# Patient Record
Sex: Male | Born: 1960 | Race: White | Hispanic: No | Marital: Single | Smoking: Never smoker
Health system: Southern US, Community
[De-identification: ages and names within clinical notes are randomized; demographics above are authoritative.]

## PROBLEM LIST (undated history)

## (undated) DIAGNOSIS — K429 Umbilical hernia without obstruction or gangrene: Secondary | ICD-10-CM

## (undated) DIAGNOSIS — N200 Calculus of kidney: Secondary | ICD-10-CM

## (undated) DIAGNOSIS — T63301A Toxic effect of unspecified spider venom, accidental (unintentional), initial encounter: Secondary | ICD-10-CM

## (undated) HISTORY — DX: Calculus of kidney: N20.0

## (undated) HISTORY — DX: Umbilical hernia without obstruction or gangrene: K42.9

## (undated) HISTORY — PX: UMBILICAL HERNIA REPAIR: SUR1181

## (undated) HISTORY — DX: Toxic effect of unspecified spider venom, accidental (unintentional), initial encounter: T63.301A

---

## 2014-09-23 ENCOUNTER — Other Ambulatory Visit (INDEPENDENT_AMBULATORY_CARE_PROVIDER_SITE_OTHER): Payer: BLUE CROSS/BLUE SHIELD

## 2014-09-23 ENCOUNTER — Ambulatory Visit (INDEPENDENT_AMBULATORY_CARE_PROVIDER_SITE_OTHER): Payer: BLUE CROSS/BLUE SHIELD | Admitting: Internal Medicine

## 2014-09-23 ENCOUNTER — Encounter: Payer: Self-pay | Admitting: Internal Medicine

## 2014-09-23 VITALS — BP 104/66 | HR 76 | Ht 69.25 in | Wt 313.2 lb

## 2014-09-23 DIAGNOSIS — K5732 Diverticulitis of large intestine without perforation or abscess without bleeding: Secondary | ICD-10-CM

## 2014-09-23 DIAGNOSIS — Z1211 Encounter for screening for malignant neoplasm of colon: Secondary | ICD-10-CM

## 2014-09-23 LAB — COMPREHENSIVE METABOLIC PANEL
ALK PHOS: 176 U/L — AB (ref 39–117)
ALT: 60 U/L — ABNORMAL HIGH (ref 0–53)
AST: 44 U/L — ABNORMAL HIGH (ref 0–37)
Albumin: 3.2 g/dL — ABNORMAL LOW (ref 3.5–5.2)
BILIRUBIN TOTAL: 1 mg/dL (ref 0.2–1.2)
BUN: 17 mg/dL (ref 6–23)
CO2: 28 mEq/L (ref 19–32)
Calcium: 8.7 mg/dL (ref 8.4–10.5)
Chloride: 102 mEq/L (ref 96–112)
Creatinine, Ser: 1 mg/dL (ref 0.4–1.5)
GFR: 83.39 mL/min (ref >60.00)
Glucose, Bld: 103 mg/dL — ABNORMAL HIGH (ref 70–99)
POTASSIUM: 4.9 meq/L (ref 3.5–5.1)
Sodium: 137 mEq/L (ref 135–145)
Total Protein: 6.6 g/dL (ref 6.0–8.3)

## 2014-09-23 LAB — CBC WITH DIFFERENTIAL/PLATELET
Basophils Absolute: 0.1 10*3/uL (ref 0.0–0.1)
Basophils Relative: 0.5 % (ref 0.0–3.0)
EOS ABS: 0.1 10*3/uL (ref 0.0–0.7)
EOS PCT: 1 % (ref 0.0–5.0)
HEMATOCRIT: 44.3 % (ref 39.0–52.0)
HEMOGLOBIN: 14.6 g/dL (ref 13.0–17.0)
Lymphocytes Relative: 67.1 % — ABNORMAL HIGH (ref 12.0–46.0)
Lymphs Abs: 8.9 10*3/uL — ABNORMAL HIGH (ref 0.7–4.0)
MCHC: 32.9 g/dL (ref 30.0–36.0)
MCV: 89 fl (ref 78.0–100.0)
MONO ABS: 1.3 10*3/uL — AB (ref 0.1–1.0)
Monocytes Relative: 9.9 % (ref 3.0–12.0)
Neutro Abs: 2.9 10*3/uL (ref 1.4–7.7)
Neutrophils Relative %: 21.5 % — ABNORMAL LOW (ref 43.0–77.0)
Platelets: 262 10*3/uL (ref 150.0–400.0)
RBC: 4.98 Mil/uL (ref 4.22–5.81)
RDW: 14.2 % (ref 11.5–15.5)
WBC: 13.3 10*3/uL — AB (ref 4.0–10.5)

## 2014-09-23 MED ORDER — AMOXICILLIN-POT CLAVULANATE 875-125 MG PO TABS: 1.0000 | ORAL_TABLET | Freq: Two times a day (BID) | ORAL | Status: DC

## 2014-09-23 NOTE — Progress Notes (Signed)
Subjective:    Patient ID: Brian BurgerGarry B Pizzi, male    DOB: 10-Oct-1960, 54 y.o.   MRN: [id number]  HPI The patient is a very pleasant middle-aged white man here because of 3-4 week history of lower quadrant abdominal pain, constipation and some low-grade fevers and even higher fevers. This is been waxing and waning. His stools are small and pellet like at times. He gets temperatures are feels hot and then chilled in the evening. He denies any urinary symptoms. He has not had any rectal bleeding. He says he had a temperature to 102 at some point. He feels very bloated after he eats as well. He has a recurrent umbilical hernia the gets a little sore but is not hard or firm or anything like that. 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 He denies having problems like this before. He has never had a colonoscopy.  Allergies  Allergen Reactions  . Codeine Nausea Only   No outpatient prescriptions prior to visit.   No facility-administered medications prior to visit.   Past Medical History  Diagnosis Date  . Kidney stones     never been diagnosed but pt states he passed one  . Spider bite wound     left wrist  . Umbilical hernia    Past Surgical History  Procedure Laterality Date  . Umbilical hernia repair     History   Social History  . Marital Status: Married    Spouse Name: N/A    Number of Children: 2  . Years of Education: N/A   Occupational History  . landscaper    Social History Main Topics  . Smoking status: Never Smoker   . Smokeless tobacco: Never Used  . Alcohol Use: 0.0 oz/week    0 Not specified per week     Comment: occasional beer or wine but rare  . Drug Use: No  . Sexual Activity: None   Other Topics Concern  . None   Social History Narrative   Patient  reports that he is divorced. He has a girlfriend in the [place] area.   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 Breast cancer Mother   . Cancer Maternal Aunt     type unknown  . Cancer Maternal Grandmother     type unknown    Review of Systems As per history of present illness. All other review of systems negative except for some lower extremity edema. This is bilateral.    Objective:   Physical Exam General:  Well-developed, well-nourished and in no acute distress, he is obese Eyes:  anicteric. ENT:   Mouth and posterior pharynx free of lesions.  Neck:   supple w/o thyromegaly or mass.  Lungs: Clear to auscultation bilaterally. Heart:  S1S2, no rubs, murmurs, gallops. Abdomen:  Obese soft, mildly tender to deep palpation in the left lower quadrant, no hepatosplenomegaly, or mass and BS+. He has a slight small reducible umbilical hernia Rectal: No mass, prostate is normal. Normal resting tone. Stool was brown and heme-negative it is soft area Lymph:  no cervical or supraclavicular adenopathy. Extremities:  Trace LE edema bilateral edema Skin   no rash. Neuro:  A&O x 3.  Psych:  appropriate mood and  Affect.      Assessment & Plan:  Diverticulitis of colon - Plan: amoxicillin-clavulanate (AUGMENTIN) 875-125 MG per tablet  Colon cancer screening  1. Medical scenario is most compatible with diverticulitis I think. I will treat that with Augmentin 875 twice a day for 10 days. 2. Subsequently he will have a screening colonoscopy. He knows to call back sooner if he is not better or worse.The risks and benefits as well as alternatives of endoscopic procedure(s) have been discussed and reviewed. All questions answered. The patient agrees to proceed. 3. We have decided to hold off on imaging at this point but is diagnostic uncertainty is an issue would probably perform  a CT of the abdomen and pelvis with contrast. 4. CBC and CMET today.  CC: Kerby Nora, MD

## 2014-09-23 NOTE — Patient Instructions (Addendum)
Your physician has requested that you go to the basement for the following lab work before leaving today: CBC, CMET  We have sent the following medications to your pharmacy for you to pick up at your convenience: Augmentin  You have been scheduled for a colonoscopy. Please follow written instructions given to you at your visit today.  Please pick up your prep supplies at the pharmacy. If you use inhalers (even only as needed), please bring them with you on the day of your procedure.  We are giving you a handout to read on diverticulosis today.  I appreciate the opportunity to care for you. Stan Headarl Gessner, M.D. FACG

## 2014-09-26 NOTE — Progress Notes (Signed)
Quick Note:  Labs show: 1) Elevated WBC - goes along with diverticulitis - not alarming 2) Abnormal liver chemistries - does he have a hx of this??  Is he feeling any better? ______

## 2014-10-25 ENCOUNTER — Ambulatory Visit (AMBULATORY_SURGERY_CENTER): Payer: BLUE CROSS/BLUE SHIELD | Admitting: Internal Medicine

## 2014-10-25 ENCOUNTER — Other Ambulatory Visit (INDEPENDENT_AMBULATORY_CARE_PROVIDER_SITE_OTHER): Payer: BLUE CROSS/BLUE SHIELD

## 2014-10-25 ENCOUNTER — Encounter: Payer: Self-pay | Admitting: Internal Medicine

## 2014-10-25 VITALS — BP 123/77 | HR 156 | Temp 95.9°F | Resp 18 | Ht 69.0 in | Wt 313.0 lb

## 2014-10-25 DIAGNOSIS — R740 Nonspecific elevation of levels of transaminase and lactic acid dehydrogenase [LDH]: Secondary | ICD-10-CM

## 2014-10-25 DIAGNOSIS — R748 Abnormal levels of other serum enzymes: Secondary | ICD-10-CM

## 2014-10-25 DIAGNOSIS — Z1211 Encounter for screening for malignant neoplasm of colon: Secondary | ICD-10-CM

## 2014-10-25 LAB — HEPATIC FUNCTION PANEL
ALT: 24 U/L (ref 0–53)
AST: 18 U/L (ref 0–37)
Albumin: 3.6 g/dL (ref 3.5–5.2)
Alkaline Phosphatase: 81 U/L (ref 39–117)
Bilirubin, Direct: 0.3 mg/dL (ref 0.0–0.3)
Total Bilirubin: 1.6 mg/dL — ABNORMAL HIGH (ref 0.2–1.2)
Total Protein: 6.4 g/dL (ref 6.0–8.3)

## 2014-10-25 NOTE — Progress Notes (Signed)
Procedure ends, to recovery, report given and VSS. 

## 2014-10-25 NOTE — Op Note (Signed)
[facility] [address] [place], [id number]   COLONOSCOPY PROCEDURE REPORT  PATIENT: Brian BurgerJones, Hicks B  MR#: [id number] BIRTHDATE: Nov 04, 1960 , 53  yrs. old GENDER: male ENDOSCOPIST: Iva Booparl E Maitlyn Penza, MD, [facility] PROCEDURE DATE:  10/25/2014 PROCEDURE:   Colonoscopy, screening First Screening Colonoscopy - Avg.  risk and is 50 yrs.  old or older Yes.  Prior Negative Screening - Now for repeat screening. N/A  History of Adenoma - Now for follow-up colonoscopy & has been > or = to 3 yrs.  N/A  Polyps Removed Today? No.  Polyps Removed Today? No.  Recommend repeat exam, <10 yrs? Polyps Removed Today? No.  Recommend repeat exam, <10 yrs? No. ASA CLASS:   Class II INDICATIONS:average risk patient for colorectal cancer. MEDICATIONS: Propofol 300 mg IV and Monitored anesthesia care  DESCRIPTION OF PROCEDURE:   After the risks benefits and alternatives of the procedure were thoroughly explained, informed consent was obtained.  The digital rectal exam revealed no abnormalities of the rectum, revealed no prostatic nodules, and revealed the prostate was not enlarged.   The LB [id number] endoscope was introduced through the anus and advanced to the cecum, which was identified by both the appendix and ileocecal valve. No adverse events experienced.   The quality of the prep was adequate, using MiraLax  The instrument was then slowly withdrawn as the colon was fully examined.      COLON FINDINGS: A normal appearing cecum, ileocecal valve, and appendiceal orifice were identified.  The ascending, transverse, descending, sigmoid colon, and rectum appeared unremarkable. Retroflexed views revealed no abnormalities. The time to cecum=2 minutes 42 seconds.  Withdrawal time=13 minutes 01 seconds.  The scope was withdrawn and the procedure completed. COMPLICATIONS: There were no immediate complications.  ENDOSCOPIC IMPRESSION: Normal colonoscopy - adequate prep - first  screening  RECOMMENDATIONS: 1.  Repeat colonoscopy 10 years. 2.  Go to lab today - testing to evaluate abnormal LFT's  eSigned:  Iva Booparl E Timmi Devora, MD, [facility] 10/25/2014 4:55 PM   cc: The Patient and Kerby Norahristopher Groner, MD

## 2014-10-25 NOTE — Patient Instructions (Addendum)
The colonoscopy looked normal.  I have ordered some blood tests to try to understand why you have abnormal liver chemistries - please go to lab today.  Next routine colonoscopy in 10 years - 2026   I appreciate the opportunity to care for you. Iva Booparl E. Gessner, MD, FACG  YOU HAD AN ENDOSCOPIC PROCEDURE TODAY AT THE [facility]: Refer to the procedure report that was given to you for any specific questions about what was found during the examination.  If the procedure report does not answer your questions, please call your gastroenterologist to clarify.  If you requested that your care partner not be given the details of your procedure findings, then the procedure report has been included in a sealed envelope for you to review at your convenience later.  YOU SHOULD EXPECT: Some feelings of bloating in the abdomen. Passage of more gas than usual.  Walking can help get rid of the air that was put into your GI tract during the procedure and reduce the bloating. If you had a lower endoscopy (such as a colonoscopy or flexible sigmoidoscopy) you may notice spotting of blood in your stool or on the toilet paper. If you underwent a bowel prep for your procedure, then you may not have a normal bowel movement for a few days.  DIET: Your first meal following the procedure should be a light meal and then it is ok to progress to your normal diet.  A half-sandwich or bowl of soup is an example of a good first meal.  Heavy or fried foods are harder to digest and may make you feel nauseous or bloated.  Likewise meals heavy in dairy and vegetables can cause extra gas to form and this can also increase the bloating.  Drink plenty of fluids but you should avoid alcoholic beverages for 24 hours.  ACTIVITY: Your care partner should take you home directly after the procedure.  You should plan to take it easy, moving slowly for the rest of the day.  You can resume normal activity the day after the  procedure however you should NOT DRIVE or use heavy machinery for 24 hours (because of the sedation medicines used during the test).    SYMPTOMS TO REPORT IMMEDIATELY: A gastroenterologist can be reached at any hour.  During normal business hours, 8:30 AM to 5:00 PM Monday through Friday, call [phone].  After hours and on weekends, please call the GI answering service at [phone] who will take a message and have the physician on call contact you.   Following lower endoscopy (colonoscopy or flexible sigmoidoscopy):  Excessive amounts of blood in the stool  Significant tenderness or worsening of abdominal pains  Swelling of the abdomen that is new, acute  Fever of 100F or higher  FOLLOW UP: If any biopsies were taken you will be contacted by phone or by letter within the next 1-3 weeks.  Call your gastroenterologist if you have not heard about the biopsies in 3 weeks.  Our staff will call the home number listed on your records the next business day following your procedure to check on you and address any questions or concerns that you may have at that time regarding the information given to you following your procedure. This is a courtesy call and so if there is no answer at the home number and we have not heard from you through the emergency physician on call, we will assume that you have returned to your regular daily  activities without incident.  SIGNATURES/CONFIDENTIALITY: You and/or your care partner have signed paperwork which will be entered into your electronic medical record.  These signatures attest to the fact that that the information above on your After Visit Summary has been reviewed and is understood.  Full responsibility of the confidentiality of this discharge information lies with you and/or your care-partner.  Lab will come up to draw your blood before you go home today.  Read all of the handouts given to you by your recovery room nurse.

## 2014-10-28 ENCOUNTER — Telehealth: Payer: Self-pay | Admitting: *Deleted

## 2014-10-28 LAB — FERRITIN: FERRITIN: 126 ng/mL (ref 22.0–322.0)

## 2014-10-28 LAB — HEPATITIS B SURFACE ANTIGEN: Hepatitis B Surface Ag: NEGATIVE

## 2014-10-28 LAB — HEPATITIS C ANTIBODY: HCV Ab: NEGATIVE

## 2014-10-28 NOTE — Telephone Encounter (Signed)
  Follow up Call-  Call back number 10/25/2014  Post procedure Call Back phone  # [phone]  Permission to leave phone message Yes     Patient questions:  Do you have a fever, pain , or abdominal swelling? No. Pain Score  0 *  Have you tolerated food without any problems? Yes.    Have you been able to return to your normal activities? Yes.    Do you have any questions about your discharge instructions: Diet   No. Medications  No. Follow up visit  No.  Do you have questions or concerns about your Care? No.  Actions: * If pain score is 4 or above: No action needed, pain <4.

## 2014-10-29 LAB — ANA: Anti Nuclear Antibody(ANA): NEGATIVE

## 2014-10-29 NOTE — Progress Notes (Signed)
Quick Note:  Abnormal liver tests are all better and no signs of infection or autoimmune problems except mild increase bilirubin but that is consistent with benign condition called Gilbert's syndrome Bottom line is this is good news See me prn  Please send a copy to his PCP ______

## 2016-10-13 ENCOUNTER — Ambulatory Visit (INDEPENDENT_AMBULATORY_CARE_PROVIDER_SITE_OTHER): Payer: BLUE CROSS/BLUE SHIELD | Admitting: Physician Assistant

## 2016-10-13 VITALS — BP 130/80 | HR 80 | Temp 100.5°F | Resp 17 | Ht 71.0 in | Wt 314.0 lb

## 2016-10-13 DIAGNOSIS — R69 Illness, unspecified: Secondary | ICD-10-CM | POA: Diagnosis not present

## 2016-10-13 DIAGNOSIS — J111 Influenza due to unidentified influenza virus with other respiratory manifestations: Secondary | ICD-10-CM

## 2016-10-13 MED ORDER — NAPROXEN 500 MG PO TABS: 500.0000 mg | ORAL_TABLET | Freq: Two times a day (BID) | ORAL | 0 refills | Status: DC

## 2016-10-13 MED ORDER — OSELTAMIVIR PHOSPHATE 75 MG PO CAPS: 75.0000 mg | ORAL_CAPSULE | Freq: Two times a day (BID) | ORAL | 0 refills | Status: DC

## 2016-10-13 NOTE — Progress Notes (Signed)
  10/13/2016 4:04 PM   DOB: Sep 27, 1960 / MRN: [id number]  SUBJECTIVE:  Brian Randall is a 56 y.o. male presenting for "feeling hot" and generalized muscle aches.  Has a mild cough along with a mild HA.  He has not had the flu shot. ZHe feels that he is getting worse.  Has tried vitamin C and zinc.     There is no immunization history on file for this patient.   He is allergic to codeine.   He  has a past medical history of Kidney stones; Spider bite wound; and Umbilical hernia.    He  reports that he has never smoked. He has never used smokeless tobacco. He reports that he drinks alcohol. He reports that he does not use drugs. The patient  has a past surgical history that includes Umbilical hernia repair.  His family history includes Breast cancer in his mother; Cancer in his maternal aunt and maternal grandmother.  Review of Systems  Constitutional: Positive for malaise/fatigue. Negative for diaphoresis.  HENT: Positive for sore throat. Negative for congestion.   Respiratory: Negative for hemoptysis and shortness of breath.   Cardiovascular: Negative for chest pain and leg swelling.  Gastrointestinal: Negative for nausea.  Skin: Negative for rash.  Neurological: Negative for dizziness.    The problem list and medications were reviewed and updated by myself where necessary and exist elsewhere in the encounter.   OBJECTIVE:  BP 130/80 (BP Location: Right Arm, Patient Position: Sitting, Cuff Size: Normal)   Pulse 80   Temp (!) 100.5 F (38.1 C) (Oral)   Resp 17   Ht 5\' 11"  (1.803 m)   Wt (!) 314 lb (142.4 kg)   SpO2 95%   BMI 43.79 kg/m   Physical Exam  Constitutional: He is oriented to person, place, and time.  HENT:  Right Ear: External ear normal.  Left Ear: External ear normal.  Nose: Nose normal.  Mouth/Throat: Oropharynx is clear and moist.  Cardiovascular: Normal rate, regular rhythm and normal heart sounds.  Exam reveals no gallop, no friction rub and no  decreased pulses.   No murmur heard. Pulmonary/Chest: Effort normal and breath sounds normal. No respiratory distress. He has no decreased breath sounds. He has no wheezes. He has no rhonchi. He has no rales.  Musculoskeletal: He exhibits no edema.  Neurological: He is alert and oriented to person, place, and time.  Skin: Skin is warm and dry. He is not diaphoretic.    No results found for this or any previous visit (from the past 72 hour(s)).  No results found.  ASSESSMENT AND PLAN:  Brian Randall was seen today for cough and fever.  Diagnoses and all orders for this visit:  Influenza-like illness Comments: Day three total.  he has not had the flu shot. Will start tamiflu in the hopes of reducing the risk of influenza related complications.  Orders: -     oseltamivir (TAMIFLU) 75 MG capsule; Take 1 capsule (75 mg total) by mouth 2 (two) times daily. -     naproxen (NAPROSYN) 500 MG tablet; Take 1 tablet (500 mg total) by mouth 2 (two) times daily with a meal.    The patient is advised to call or return to clinic if he does not see an improvement in symptoms, or to seek the care of the closest emergency department if he worsens with the above plan.   Deliah BostonMichael Percy Winterrowd, MHS, PA-C [facility] 10/13/2016 4:04 PM

## 2016-10-13 NOTE — Patient Instructions (Signed)
     IF you received an x-ray today, you will receive an invoice from [facility]. Please contact [facility] at [phone] with questions or concerns regarding your invoice.   IF you received labwork today, you will receive an invoice from [facility]. Please contact [facility] at [phone] with questions or concerns regarding your invoice.   Our billing staff will not be able to assist you with questions regarding bills from these companies.  You will be contacted with the lab results as soon as they are available. The fastest way to get your results is to activate your My Chart account. Instructions are located on the last page of this paperwork. If you have not heard from us regarding the results in 2 weeks, please contact this office.     

## 2016-10-18 ENCOUNTER — Encounter: Payer: Self-pay | Admitting: Physician Assistant

## 2016-10-18 ENCOUNTER — Ambulatory Visit (INDEPENDENT_AMBULATORY_CARE_PROVIDER_SITE_OTHER): Payer: BLUE CROSS/BLUE SHIELD | Admitting: Physician Assistant

## 2016-10-18 VITALS — BP 109/74 | HR 70 | Temp 98.4°F | Resp 16 | Ht 71.0 in | Wt 311.0 lb

## 2016-10-18 DIAGNOSIS — R05 Cough: Secondary | ICD-10-CM | POA: Diagnosis not present

## 2016-10-18 DIAGNOSIS — R69 Illness, unspecified: Secondary | ICD-10-CM | POA: Diagnosis not present

## 2016-10-18 DIAGNOSIS — J111 Influenza due to unidentified influenza virus with other respiratory manifestations: Secondary | ICD-10-CM

## 2016-10-18 DIAGNOSIS — R059 Cough, unspecified: Secondary | ICD-10-CM

## 2016-10-18 MED ORDER — AZITHROMYCIN 250 MG PO TABS: ORAL_TABLET | ORAL | 0 refills | Status: DC

## 2016-10-18 NOTE — Patient Instructions (Signed)
     IF you received an x-ray today, you will receive an invoice from [facility]. Please contact [facility] at [phone] with questions or concerns regarding your invoice.   IF you received labwork today, you will receive an invoice from [facility]. Please contact [facility] at [phone] with questions or concerns regarding your invoice.   Our billing staff will not be able to assist you with questions regarding bills from these companies.  You will be contacted with the lab results as soon as they are available. The fastest way to get your results is to activate your My Chart account. Instructions are located on the last page of this paperwork. If you have not heard from us regarding the results in 2 weeks, please contact this office.     

## 2016-10-18 NOTE — Progress Notes (Signed)
  10/18/2016 2:12 PM   DOB: Feb 06, 1961 / MRN: [id number]  SUBJECTIVE:  Sherryll BurgerGarry B Hehn is a 56 y.o. male presenting for recheck of cough and flu like illness. Has been taking tamiflu and naprosyn with good relief.   He is here because he continues to cough and has left sided paraspinal pain with coughing only.   He is allergic to codeine.   He  has a past medical history of Kidney stones; Spider bite wound; and Umbilical hernia.    He  reports that he has never smoked. He has never used smokeless tobacco. He reports that he drinks alcohol. He reports that he does not use drugs. He  has no sexual activity history on file. The patient  has a past surgical history that includes Umbilical hernia repair.  His family history includes Breast cancer in his mother; Cancer in his maternal aunt and maternal grandmother.  Review of Systems  Constitutional: Negative for fever.  Respiratory: Positive for cough. Negative for hemoptysis, sputum production, shortness of breath and wheezing.   Cardiovascular: Negative for leg swelling.  Gastrointestinal: Negative for nausea.  Skin: Negative for rash.  Neurological: Negative for dizziness.    The problem list and medications were reviewed and updated by myself where necessary and exist elsewhere in the encounter.   OBJECTIVE:  BP 109/74   Pulse 70   Temp 98.4 F (36.9 C) (Oral)   Resp 16   Ht 5\' 11"  (1.803 m)   Wt (!) 311 lb (141.1 kg)   SpO2 96%   BMI 43.38 kg/m   Wt Readings from Last 3 Encounters:  10/18/16 (!) 311 lb (141.1 kg)  10/13/16 (!) 314 lb (142.4 kg)  10/25/14 (!) 313 lb (142 kg)   Temp Readings from Last 3 Encounters:  10/18/16 98.4 F (36.9 C) (Oral)  10/13/16 (!) 100.5 F (38.1 C) (Oral)  10/25/14 (!) 95.9 F (35.5 C) (Tympanic)   BP Readings from Last 3 Encounters:  10/18/16 109/74  10/13/16 130/80  10/25/14 123/77   Pulse Readings from Last 3 Encounters:  10/18/16 70  10/13/16 80  10/25/14 (!) 156      Physical Exam  Constitutional: He is oriented to person, place, and time. He appears well-developed and well-nourished. No distress.  Cardiovascular: Normal rate and regular rhythm.   Pulmonary/Chest: Effort normal and breath sounds normal. He has no wheezes. He has no rales.  Neurological: He is alert and oriented to person, place, and time.  Skin: He is not diaphoretic.    No results found for this or any previous visit (from the past 72 hour(s)).  No results found.  ASSESSMENT AND PLAN:  [name] was seen today for follow-up and shoulder pain.  Diagnoses and all orders for this visit:  Influenza-like illness: Resolving.  However he did just have the flu and given the possibility of a 2/2 pneumonia I think it the benefits of zpack outweigh the risk.  He is in agreement.   Cough -     azithromycin (ZITHROMAX) 250 MG tablet; Take 2 on day one and one daily thereafter.    The patient is advised to call or return to clinic if he does not see an improvement in symptoms, or to seek the care of the closest emergency department if he worsens with the above plan.   Deliah BostonMichael Clark, MHS, PA-C [facility] 10/18/2016 2:12 PM

## 2019-06-04 ENCOUNTER — Inpatient Hospital Stay (HOSPITAL_COMMUNITY): Payer: BC Managed Care – PPO

## 2019-06-04 ENCOUNTER — Other Ambulatory Visit: Payer: Self-pay

## 2019-06-04 ENCOUNTER — Encounter (HOSPITAL_COMMUNITY): Payer: Self-pay | Admitting: Emergency Medicine

## 2019-06-04 ENCOUNTER — Emergency Department (HOSPITAL_COMMUNITY): Payer: BC Managed Care – PPO

## 2019-06-04 ENCOUNTER — Inpatient Hospital Stay (HOSPITAL_COMMUNITY)
Admission: EM | Admit: 2019-06-04 | Discharge: 2019-06-06 | DRG: 041 | Disposition: A | Discharge status: Home / Self Care | Payer: BC Managed Care – PPO | Attending: Neurology | Admitting: Neurology

## 2019-06-04 DIAGNOSIS — E039 Hypothyroidism, unspecified: Secondary | ICD-10-CM | POA: Diagnosis present

## 2019-06-04 DIAGNOSIS — G43109 Migraine with aura, not intractable, without status migrainosus: Secondary | ICD-10-CM | POA: Diagnosis not present

## 2019-06-04 DIAGNOSIS — I071 Rheumatic tricuspid insufficiency: Secondary | ICD-10-CM | POA: Diagnosis present

## 2019-06-04 DIAGNOSIS — R4701 Aphasia: Secondary | ICD-10-CM | POA: Diagnosis present

## 2019-06-04 DIAGNOSIS — R1310 Dysphagia, unspecified: Secondary | ICD-10-CM | POA: Diagnosis present

## 2019-06-04 DIAGNOSIS — G4733 Obstructive sleep apnea (adult) (pediatric): Secondary | ICD-10-CM

## 2019-06-04 DIAGNOSIS — Z885 Allergy status to narcotic agent status: Secondary | ICD-10-CM | POA: Diagnosis not present

## 2019-06-04 DIAGNOSIS — D751 Secondary polycythemia: Secondary | ICD-10-CM | POA: Diagnosis present

## 2019-06-04 DIAGNOSIS — R2990 Unspecified symptoms and signs involving the nervous system: Secondary | ICD-10-CM | POA: Diagnosis present

## 2019-06-04 DIAGNOSIS — G43909 Migraine, unspecified, not intractable, without status migrainosus: Secondary | ICD-10-CM | POA: Diagnosis present

## 2019-06-04 DIAGNOSIS — Z9282 Status post administration of tPA (rtPA) in a different facility within the last 24 hours prior to admission to current facility: Secondary | ICD-10-CM | POA: Diagnosis not present

## 2019-06-04 DIAGNOSIS — Z20828 Contact with and (suspected) exposure to other viral communicable diseases: Secondary | ICD-10-CM | POA: Diagnosis present

## 2019-06-04 DIAGNOSIS — K222 Esophageal obstruction: Secondary | ICD-10-CM | POA: Diagnosis present

## 2019-06-04 DIAGNOSIS — I6389 Other cerebral infarction: Secondary | ICD-10-CM | POA: Diagnosis not present

## 2019-06-04 DIAGNOSIS — G459 Transient cerebral ischemic attack, unspecified: Principal | ICD-10-CM | POA: Diagnosis present

## 2019-06-04 DIAGNOSIS — I63412 Cerebral infarction due to embolism of left middle cerebral artery: Secondary | ICD-10-CM | POA: Diagnosis not present

## 2019-06-04 DIAGNOSIS — I639 Cerebral infarction, unspecified: Secondary | ICD-10-CM | POA: Diagnosis not present

## 2019-06-04 DIAGNOSIS — H53461 Homonymous bilateral field defects, right side: Secondary | ICD-10-CM | POA: Diagnosis present

## 2019-06-04 DIAGNOSIS — N400 Enlarged prostate without lower urinary tract symptoms: Secondary | ICD-10-CM | POA: Diagnosis present

## 2019-06-04 DIAGNOSIS — Z9989 Dependence on other enabling machines and devices: Secondary | ICD-10-CM | POA: Diagnosis not present

## 2019-06-04 DIAGNOSIS — G43009 Migraine without aura, not intractable, without status migrainosus: Secondary | ICD-10-CM | POA: Diagnosis not present

## 2019-06-04 DIAGNOSIS — I10 Essential (primary) hypertension: Secondary | ICD-10-CM | POA: Diagnosis present

## 2019-06-04 LAB — COMPREHENSIVE METABOLIC PANEL
ALT: 20 U/L (ref 0–44)
AST: 20 U/L (ref 15–41)
Albumin: 3.8 g/dL (ref 3.5–5.0)
Alkaline Phosphatase: 62 U/L (ref 38–126)
Anion gap: 10 (ref 5–15)
BUN: 18 mg/dL (ref 6–20)
CO2: 27 mmol/L (ref 22–32)
Calcium: 9.5 mg/dL (ref 8.9–10.3)
Chloride: 102 mmol/L (ref 98–111)
Creatinine, Ser: 1.28 mg/dL — ABNORMAL HIGH (ref 0.61–1.24)
GFR calc Af Amer: >60 mL/min (ref >60)
GFR calc non Af Amer: >60 mL/min (ref >60)
Glucose, Bld: 118 mg/dL — ABNORMAL HIGH (ref 70–99)
Potassium: 4.5 mmol/L (ref 3.5–5.1)
Sodium: 139 mmol/L (ref 135–145)
Total Bilirubin: 1.8 mg/dL — ABNORMAL HIGH (ref 0.3–1.2)
Total Protein: 7.2 g/dL (ref 6.5–8.1)

## 2019-06-04 LAB — PROTIME-INR
INR: 1.1 (ref 0.8–1.2)
Prothrombin Time: 14.1 seconds (ref 11.4–15.2)

## 2019-06-04 LAB — DIFFERENTIAL
Abs Immature Granulocytes: 0.02 10*3/uL (ref 0.00–0.07)
Basophils Absolute: 0.1 10*3/uL (ref 0.0–0.1)
Basophils Relative: 1 %
Eosinophils Absolute: 0.1 10*3/uL (ref 0.0–0.5)
Eosinophils Relative: 1 %
Immature Granulocytes: 0 %
Lymphocytes Relative: 41 %
Lymphs Abs: 4.1 10*3/uL — ABNORMAL HIGH (ref 0.7–4.0)
Monocytes Absolute: 0.8 10*3/uL (ref 0.1–1.0)
Monocytes Relative: 8 %
Neutro Abs: 5 10*3/uL (ref 1.7–7.7)
Neutrophils Relative %: 49 %

## 2019-06-04 LAB — I-STAT CHEM 8, ED
BUN: 21 mg/dL — ABNORMAL HIGH (ref 6–20)
Calcium, Ion: 1.1 mmol/L — ABNORMAL LOW (ref 1.15–1.40)
Chloride: 100 mmol/L (ref 98–111)
Creatinine, Ser: 1.2 mg/dL (ref 0.61–1.24)
Glucose, Bld: 114 mg/dL — ABNORMAL HIGH (ref 70–99)
HCT: 54 % — ABNORMAL HIGH (ref 39.0–52.0)
Hemoglobin: 18.4 g/dL — ABNORMAL HIGH (ref 13.0–17.0)
Potassium: 4.3 mmol/L (ref 3.5–5.1)
Sodium: 140 mmol/L (ref 135–145)
TCO2: 27 mmol/L (ref 22–32)

## 2019-06-04 LAB — CBG MONITORING, ED: Glucose-Capillary: 120 mg/dL — ABNORMAL HIGH (ref 70–99)

## 2019-06-04 LAB — CBC
HCT: 55.5 % — ABNORMAL HIGH (ref 39.0–52.0)
Hemoglobin: 18.3 g/dL — ABNORMAL HIGH (ref 13.0–17.0)
MCH: 30.3 pg (ref 26.0–34.0)
MCHC: 33 g/dL (ref 30.0–36.0)
MCV: 92 fL (ref 80.0–100.0)
Platelets: 184 10*3/uL (ref 150–400)
RBC: 6.03 MIL/uL — ABNORMAL HIGH (ref 4.22–5.81)
RDW: 16.8 % — ABNORMAL HIGH (ref 11.5–15.5)
WBC: 10.2 10*3/uL (ref 4.0–10.5)
nRBC: 0 % (ref 0.0–0.2)

## 2019-06-04 LAB — ECHOCARDIOGRAM COMPLETE: Weight: 5070.4 oz

## 2019-06-04 LAB — GLUCOSE, CAPILLARY: Glucose-Capillary: 117 mg/dL — ABNORMAL HIGH (ref 70–99)

## 2019-06-04 LAB — APTT: aPTT: 32 seconds (ref 24–36)

## 2019-06-04 LAB — SARS CORONAVIRUS 2 (TAT 6-24 HRS): SARS Coronavirus 2: NEGATIVE

## 2019-06-04 LAB — MRSA PCR SCREENING: MRSA by PCR: NEGATIVE

## 2019-06-04 MED ORDER — ACETAMINOPHEN 160 MG/5ML PO SOLN: 650.0000 mg | ORAL | Status: DC | PRN

## 2019-06-04 MED ORDER — STROKE: EARLY STAGES OF RECOVERY BOOK
Freq: Once | Status: AC
  Administered 2019-06-04: 17:00:00
  Filled 2019-06-04: qty 1

## 2019-06-04 MED ORDER — INFLUENZA VAC SPLIT QUAD 0.5 ML IM SUSY
0.5000 mL | PREFILLED_SYRINGE | INTRAMUSCULAR | Status: DC
  Filled 2019-06-04: qty 0.5

## 2019-06-04 MED ORDER — PANTOPRAZOLE SODIUM 40 MG PO TBEC
40.0000 mg | DELAYED_RELEASE_TABLET | Freq: Every day | ORAL | Status: DC
  Administered 2019-06-04 – 2019-06-05 (×2): 40 mg via ORAL
  Filled 2019-06-04 (×2): qty 1

## 2019-06-04 MED ORDER — CHLORHEXIDINE GLUCONATE CLOTH 2 % EX PADS
6.0000 | MEDICATED_PAD | Freq: Every day | CUTANEOUS | Status: DC
  Administered 2019-06-05 – 2019-06-06 (×2): 6 via TOPICAL

## 2019-06-04 MED ORDER — CLEVIDIPINE BUTYRATE 0.5 MG/ML IV EMUL: 0.0000 mg/h | INTRAVENOUS | Status: DC

## 2019-06-04 MED ORDER — ACETAMINOPHEN 650 MG RE SUPP: 650.0000 mg | RECTAL | Status: DC | PRN

## 2019-06-04 MED ORDER — ALTEPLASE (STROKE) FULL DOSE INFUSION
90.0000 mg | Freq: Once | INTRAVENOUS | Status: AC
  Administered 2019-06-04: 90 mg via INTRAVENOUS
  Filled 2019-06-04: qty 100

## 2019-06-04 MED ORDER — ACETAMINOPHEN 325 MG PO TABS
650.0000 mg | ORAL_TABLET | ORAL | Status: DC | PRN
  Administered 2019-06-05: 650 mg via ORAL
  Filled 2019-06-04: qty 2

## 2019-06-04 MED ORDER — SODIUM CHLORIDE 0.9 % IV SOLN
INTRAVENOUS | Status: DC
  Administered 2019-06-04: 14:00:00 via INTRAVENOUS

## 2019-06-04 MED ORDER — SODIUM CHLORIDE 0.9% FLUSH: 3.0000 mL | Freq: Once | INTRAVENOUS | Status: DC

## 2019-06-04 MED ORDER — PERFLUTREN LIPID MICROSPHERE
1.0000 mL | INTRAVENOUS | Status: AC | PRN
  Administered 2019-06-04: 4 mL via INTRAVENOUS
  Filled 2019-06-04: qty 10

## 2019-06-04 MED ORDER — CLEVIDIPINE BUTYRATE 0.5 MG/ML IV EMUL
INTRAVENOUS | Status: AC
  Filled 2019-06-04: qty 50

## 2019-06-04 MED ORDER — PANTOPRAZOLE SODIUM 40 MG IV SOLR: 40.0000 mg | Freq: Every day | INTRAVENOUS | Status: DC

## 2019-06-04 MED ORDER — IOHEXOL 350 MG/ML SOLN
100.0000 mL | Freq: Once | INTRAVENOUS | Status: AC | PRN
  Administered 2019-06-04: 12:00:00 100 mL via INTRAVENOUS

## 2019-06-04 MED ORDER — SENNOSIDES-DOCUSATE SODIUM 8.6-50 MG PO TABS: 1.0000 | ORAL_TABLET | Freq: Every evening | ORAL | Status: DC | PRN

## 2019-06-04 MED ORDER — ONDANSETRON HCL 4 MG/2ML IJ SOLN
4.0000 mg | Freq: Once | INTRAMUSCULAR | Status: AC
  Administered 2019-06-04: 14:00:00 4 mg via INTRAVENOUS
  Filled 2019-06-04: qty 2

## 2019-06-04 MED ORDER — SODIUM CHLORIDE 0.9 % IV SOLN
50.0000 mL | Freq: Once | INTRAVENOUS | Status: AC
  Administered 2019-06-04: 50 mL via INTRAVENOUS

## 2019-06-04 NOTE — Progress Notes (Signed)
Pharmacist Code Stroke Response  Notified to mix tPA at 1204 by Dr. Erlinda Hong Delivered tPA to RN at 1206  tPA dose = 9mg  bolus over 1 minute followed by 81mg  for a total dose of 90mg  over 1 hour  Issues/delays encountered (if applicable): n/a  Elicia Lamp, PharmD, BCPS Please check AMION for all [facility] contact numbers Clinical Pharmacist 06/04/2019 12:15 PM

## 2019-06-04 NOTE — H&P (Signed)
Stroke Neurology admission Note  The history was obtained from the EMS and daughter.  During history and examination, all items were able to obtain unless otherwise noted.  History of Present Illness:  Brian Randall is a 58 y.o. Caucasian male with PMH of HTN, Hypothyroidism, hiatal hernia, BPH, dysphagia with esophageal polyp admitted for stroke.  Pt was at his normal health this am. Last seen well 10:45am. Around 11am son saw him having difficulty with words out, confusion, not quite understand questions. Initially reported left sided weakness. However, in ER pt has no motor deficit but found to have global aphasia and right lower quadrantanopia. CT no acute abnormality and pt has no contraindication for tPA. After discussion with daughter and pt, tPA was given. BP 160/102 at time of tPA infusion.   Pt had recent esophageal stretch procedure, found to have polyp and biopsy benign. He is on flomax and synthroid at home.   LSN: 10:45 am tPA Given: Yes  History reviewed. No pertinent past medical history.  History reviewed. No pertinent surgical history.  No family history on file.  Social History:  has no history on file for tobacco, alcohol, and drug.  Allergies: Not on File  No current facility-administered medications on file prior to encounter.    No current outpatient medications on file prior to encounter.    Review of Systems: A full ROS was attempted today and was able to be performed.  Systems assessed include - Constitutional, Eyes, HENT, Respiratory, Cardiovascular, Gastrointestinal, Genitourinary, Integument/breast, Hematologic/lymphatic, Musculoskeletal, Neurological, Behavioral/Psych, Endocrine, Allergic/Immunologic - with pertinent responses as per HPI.  Physical Examination: Temp:  [98.3 F (36.8 C)] 98.3 F (36.8 C) (09/21 1210) Pulse Rate:  [69-90] 69 (09/21 1255) Resp:  [16] 16 (09/21 1255) BP: (139-163)/(90-102) 139/92 (09/21 1255) SpO2:  [92 %-98 %] 95 % (09/21  1255) Weight:  [143.7 kg] 143.7 kg (09/21 1239)  General - obese, well developed, in no apparent distress.    Ophthalmologic - fundi not visualized due to noncooperation.    Cardiovascular - regular rate and rhythm  Mental Status -  Awake alert. However, partial global aphasia, not following simple commands. Able to tell me his name but not able to answer other questions, able to cooperative on visual field testing but not able to follow commands on the hand or foot.   Cranial Nerves II - XII - II - right lower quadrantanopia. III, IV, VI - Extraocular movements intact, but more left gaze preference. V - Facial sensation intact bilaterally. VII - slight right nasolabial fold flattening. VIII - Hearing & vestibular intact bilaterally. X - Palate elevates symmetrically. XI - Chin turning & shoulder shrug intact bilaterally. XII - Tongue protrusion intact.  Motor Strength - The patient's strength was normal in all extremities and pronator drift was absent.   Motor Tone & Bulk - Muscle tone was assessed at the neck and appendages and was normal.  Bulk was normal and fasciculations were absent.   Reflexes - The patient's reflexes were normal in all extremities and he had no pathological reflexes.  Sensory - Light touch, temperature/pinprick were not cooperative.    Coordination - not cooperative on exam.  Tremor was absent.  Gait and Station - deferred  Data Reviewed: Ct Angio Head W Or Wo Contrast  Result Date: 06/04/2019 CLINICAL DATA:  Code stroke.  Left-sided weakness. EXAM: CT ANGIOGRAPHY HEAD AND NECK CT PERFUSION BRAIN TECHNIQUE: Multidetector CT imaging of the head and neck was performed using the standard protocol during  bolus administration of intravenous contrast. Multiplanar CT image reconstructions and MIPs were obtained to evaluate the vascular anatomy. Carotid stenosis measurements (when applicable) are obtained utilizing NASCET criteria, using the distal internal  carotid diameter as the denominator. Multiphase CT imaging of the brain was performed following IV bolus contrast injection. Subsequent parametric perfusion maps were calculated using RAPID software. CONTRAST:  OMNIPAQUE IOHEXOL 350 MG/ML SOLN COMPARISON:  Head CT earlier same day FINDINGS: CTA NECK FINDINGS Aortic arch: Aortic atherosclerosis. No aneurysm or dissection. Branching pattern is normal. Right carotid system: Common carotid artery widely patent to the bifurcation. Soft and calcified plaque at the carotid bifurcation and ICA bulb but no stenosis. Cervical ICA is tortuous but widely patent to the skull base. Left carotid system: Common carotid artery widely patent to the bifurcation. Soft and calcified plaque at the bifurcation and ICA bulb. No stenosis. Cervical ICA is tortuous but widely patent to the skull base. Vertebral arteries: Each vertebral artery origin appears widely patent. Both vertebral arteries appear normal through the cervical region to foramen magnum. Skeleton: Ordinary spondylosis. Other neck: No mass or lymphadenopathy. Upper chest: Negative Review of the MIP images confirms the above findings CTA HEAD FINDINGS Anterior circulation: Both internal carotid arteries are patent through the skull base and siphon regions. There is atherosclerotic calcification in the carotid siphon regions but no stenosis greater than 30%. The anterior and middle cerebral vessels show flow without large or medium vessel occlusion. Distal branch vessels do show atherosclerotic irregularity. Posterior circulation: Both vertebral arteries are patent at the foramen magnum. There is atherosclerotic disease within the right V4 segment with irregular stenosis estimated at 50%. Beyond that the vessel is widely patent to the basilar. No basilar stenosis. Posterior circulation branch vessels remain patent. Venous sinuses: Patent and normal. Anatomic variants: None significant. Review of the MIP images confirms  the above findings CT Brain Perfusion Findings: ASPECTS: 10 CBF (<30%) Volume: Perfusion (Tmax>6.0s) volume: . I think this reflects poor cardiac output also related to diffuse small vessel disease. Infarction Location:No infarct suspected. IMPRESSION: No large or medium vessel occlusion. Atherosclerotic disease at both carotid bifurcations but without stenosis. Intracranial small vessel atherosclerotic irregularity. Irregular 50% stenosis of the right vertebral artery V4 segment. No other posterior circulation finding. Perfusion study does not suggest any acute insult. There is a large amount of brain with T-max greater than 6 seconds, but this is felt to reflect diminished cardiac output and small-vessel disease. Electronically Signed   By: Paulina Fusi M.D.   On: 06/04/2019 13:01   Ct Angio Neck W Or Wo Contrast  Result Date: 06/04/2019 CLINICAL DATA:  Code stroke.  Left-sided weakness. EXAM: CT ANGIOGRAPHY HEAD AND NECK CT PERFUSION BRAIN TECHNIQUE: Multidetector CT imaging of the head and neck was performed using the standard protocol during bolus administration of intravenous contrast. Multiplanar CT image reconstructions and MIPs were obtained to evaluate the vascular anatomy. Carotid stenosis measurements (when applicable) are obtained utilizing NASCET criteria, using the distal internal carotid diameter as the denominator. Multiphase CT imaging of the brain was performed following IV bolus contrast injection. Subsequent parametric perfusion maps were calculated using RAPID software. CONTRAST:  OMNIPAQUE IOHEXOL 350 MG/ML SOLN COMPARISON:  Head CT earlier same day FINDINGS: CTA NECK FINDINGS Aortic arch: Aortic atherosclerosis. No aneurysm or dissection. Branching pattern is normal. Right carotid system: Common carotid artery widely patent to the bifurcation. Soft and calcified plaque at the carotid bifurcation and ICA bulb but no stenosis. Cervical ICA is  tortuous but widely patent to the  skull base. Left carotid system: Common carotid artery widely patent to the bifurcation. Soft and calcified plaque at the bifurcation and ICA bulb. No stenosis. Cervical ICA is tortuous but widely patent to the skull base. Vertebral arteries: Each vertebral artery origin appears widely patent. Both vertebral arteries appear normal through the cervical region to foramen magnum. Skeleton: Ordinary spondylosis. Other neck: No mass or lymphadenopathy. Upper chest: Negative Review of the MIP images confirms the above findings CTA HEAD FINDINGS Anterior circulation: Both internal carotid arteries are patent through the skull base and siphon regions. There is atherosclerotic calcification in the carotid siphon regions but no stenosis greater than 30%. The anterior and middle cerebral vessels show flow without large or medium vessel occlusion. Distal branch vessels do show atherosclerotic irregularity. Posterior circulation: Both vertebral arteries are patent at the foramen magnum. There is atherosclerotic disease within the right V4 segment with irregular stenosis estimated at 50%. Beyond that the vessel is widely patent to the basilar. No basilar stenosis. Posterior circulation branch vessels remain patent. Venous sinuses: Patent and normal. Anatomic variants: None significant. Review of the MIP images confirms the above findings CT Brain Perfusion Findings: ASPECTS: 10 CBF (<30%) Volume: 0ML Perfusion (Tmax>6.0s) volume: 355mL. I think this reflects poor cardiac output also related to diffuse small vessel disease. Infarction Location:No infarct suspected. IMPRESSION: No large or medium vessel occlusion. Atherosclerotic disease at both carotid bifurcations but without stenosis. Intracranial small vessel atherosclerotic irregularity. Irregular 50% stenosis of the right vertebral artery V4 segment. No other posterior circulation finding. Perfusion study does not suggest any acute insult. There is a large amount of brain  with T-max greater than 6 seconds, but this is felt to reflect diminished cardiac output and small-vessel disease. Electronically Signed   By: Nelson Chimes M.D.   On: 06/04/2019 13:01   Ct Cerebral Perfusion W Contrast  Result Date: 06/04/2019 CLINICAL DATA:  Code stroke.  Left-sided weakness. EXAM: CT ANGIOGRAPHY HEAD AND NECK CT PERFUSION BRAIN TECHNIQUE: Multidetector CT imaging of the head and neck was performed using the standard protocol during bolus administration of intravenous contrast. Multiplanar CT image reconstructions and MIPs were obtained to evaluate the vascular anatomy. Carotid stenosis measurements (when applicable) are obtained utilizing NASCET criteria, using the distal internal carotid diameter as the denominator. Multiphase CT imaging of the brain was performed following IV bolus contrast injection. Subsequent parametric perfusion maps were calculated using RAPID software. CONTRAST:  132mL OMNIPAQUE IOHEXOL 350 MG/ML SOLN COMPARISON:  Head CT earlier same day FINDINGS: CTA NECK FINDINGS Aortic arch: Aortic atherosclerosis. No aneurysm or dissection. Branching pattern is normal. Right carotid system: Common carotid artery widely patent to the bifurcation. Soft and calcified plaque at the carotid bifurcation and ICA bulb but no stenosis. Cervical ICA is tortuous but widely patent to the skull base. Left carotid system: Common carotid artery widely patent to the bifurcation. Soft and calcified plaque at the bifurcation and ICA bulb. No stenosis. Cervical ICA is tortuous but widely patent to the skull base. Vertebral arteries: Each vertebral artery origin appears widely patent. Both vertebral arteries appear normal through the cervical region to foramen magnum. Skeleton: Ordinary spondylosis. Other neck: No mass or lymphadenopathy. Upper chest: Negative Review of the MIP images confirms the above findings CTA HEAD FINDINGS Anterior circulation: Both internal carotid arteries are patent through  the skull base and siphon regions. There is atherosclerotic calcification in the carotid siphon regions but no stenosis greater than 30%. The anterior  and middle cerebral vessels show flow without large or medium vessel occlusion. Distal branch vessels do show atherosclerotic irregularity. Posterior circulation: Both vertebral arteries are patent at the foramen magnum. There is atherosclerotic disease within the right V4 segment with irregular stenosis estimated at 50%. Beyond that the vessel is widely patent to the basilar. No basilar stenosis. Posterior circulation branch vessels remain patent. Venous sinuses: Patent and normal. Anatomic variants: None significant. Review of the MIP images confirms the above findings CT Brain Perfusion Findings: ASPECTS: 10 CBF (<30%) Volume: 0ML Perfusion (Tmax>6.0s) volume: 345mL. I think this reflects poor cardiac output also related to diffuse small vessel disease. Infarction Location:No infarct suspected. IMPRESSION: No large or medium vessel occlusion. Atherosclerotic disease at both carotid bifurcations but without stenosis. Intracranial small vessel atherosclerotic irregularity. Irregular 50% stenosis of the right vertebral artery V4 segment. No other posterior circulation finding. Perfusion study does not suggest any acute insult. There is a large amount of brain with T-max greater than 6 seconds, but this is felt to reflect diminished cardiac output and small-vessel disease. Electronically Signed   By: Paulina FusiMark  Shogry M.D.   On: 06/04/2019 13:01   Ct Head Code Stroke Wo Contrast  Result Date: 06/04/2019 CLINICAL DATA:  Code stroke. Aphasia. Left-sided weakness. Last seen normal 1.5 hours ago. EXAM: CT HEAD WITHOUT CONTRAST TECHNIQUE: Contiguous axial images were obtained from the base of the skull through the vertex without intravenous contrast. COMPARISON:  None. FINDINGS: Brain: No acute cortical infarct is present. Basal ganglia are intact. Insular ribbon is normal.  The ventricles are of normal size. No significant extraaxial fluid collection is present. The brainstem and cerebellum are within normal limits. Vascular: Atherosclerotic calcifications are present within the cavernous internal carotid arteries and at the dural margin of both vertebral arteries. Vessels are mildly dense throughout. There is no significant asymmetry. Skull: Calvarium is intact. No focal lytic or blastic lesions are present. Sinuses/Orbits: The paranasal sinuses and mastoid air cells are clear. The globes and orbits are within normal limits. ASPECTS [facility]([place] Stroke Program Early CT Score) - Ganglionic level infarction (caudate, lentiform nuclei, internal capsule, insula, M1-M3 cortex): 7/7 - Supraganglionic infarction (M4-M6 cortex): 3/3 Total score (0-10 with 10 being normal): 10/10 IMPRESSION: 1. Normal CT appearance of the brain.  No acute infarct evident. 2. Atherosclerotic changes noted within the cavernous internal carotid arteries and at the dural margin of the vertebral arteries bilaterally without asymmetric hyperdense vessel 3. ASPECTS is 10/10 The above was relayed via text pager to Dr. Lebron ConnersMARCY PFEIFFER on 06/04/2019 at 12:37 . Electronically Signed   By: Marin Robertshristopher  Mattern M.D.   On: 06/04/2019 12:38    Assessment: 58 y.o. male with PMH of HTN, Hypothyroidism, hiatal hernia, BPH, dysphagia with esophageal polyp presented for code stroke. Last seen well 10:45am. Found to have partial global aphasia and right lower quadrantanopia. CT no acute abnormality and pt has no contraindication for tPA. After discussion with daughter and pt, tPA was given. BP 160/102 at time of tPA infusion. CTA head and neck showed no LVO. Pt will admitted to ICU for post tPA care.  Stroke Risk Factors - hypertension  Plan: - Admit to ICU for routine post IV tPA care  - Check blood pressure and NIHSS every 15 min for 2 h, then every 30 min for 6 h, and finally every hour for 16 h - BP goal < 180/105 - CT or  MRI brain 24 hours post tPA - Stat CT head without contrast if acute  neuro changes - NPO until swallowing screen performed and passed - No antiplatelet agents or anticoagulants (including heparin for DVT prophylaxis) in first 24 hours - No Foley catheter, nasogastric tube, arterial catheter or central venous catheter for 24 hours, unless absolutely necessary - Telemetry - Euglycemia  - Avoid hyperthermia, PRN acetaminophen - DVT prophylaxis with SCDs  This patient is critically ill due to acute stroke, HTN and at significant risk of neurological worsening, death form hemorrhagic conversion, seizure, bleeding, heart failure. This patient's care requires constant monitoring of vital signs, hemodynamics, respiratory and cardiac monitoring, review of multiple databases, neurological assessment, discussion with family, other specialists and medical decision making of high complexity. I spent 60 minutes of neurocritical care time in the care of this patient. I had long discussion with pt and daughter at bedside, updated pt current condition, treatment plan and potential prognosis. They expressed understanding and appreciation.   Marvel Plan, MD PhD Stroke Neurology 06/04/2019 1:12 PM

## 2019-06-04 NOTE — ED Triage Notes (Signed)
Pt presents as a Code stroke. LKW was 1100 this morning. Pt was working when his son noticed him sitting on the ground having trouble speaking

## 2019-06-04 NOTE — ED Provider Notes (Signed)
[place] EMERGENCY DEPARTMENT Provider Note   CSN: [id number] Arrival date & time: 06/04/19  1152  An emergency department physician performed an initial assessment on this suspected stroke patient at 1150.  History   Chief Complaint No chief complaint on file.   HPI Taner Rzepka is a 58 y.o. male.     HPI Patient was working with his son when he all of a sudden started having trouble talking.  His speech was not making sense and he could not get out what he wanted to say.  He also noticed that his left mouth was starting to droop.  This occurred at 11 AM.  EMS was immediately called.  Patient arrived as code stroke and was taken straight to CT.  Neurology initiated TPA.  Patient now reports that his speech is improved.  He reports he feels a very slight headache and feels nauseated.  Patient was not sick prior to onset of symptoms.  He had been active and well doing chores.  He denies he was having any chest pain or shortness of breath prior to onset of symptoms. History reviewed. No pertinent past medical history.  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 No pertinent surgical history.      Home Medications    Prior to Admission medications   Not on File    Family History No family history on file.  Social History Social History   Tobacco Use   Smoking status: Not on file  Substance Use Topics   Alcohol use: Not on file   Drug use: Not on file     Allergies   Patient has no allergy information on record.   Review of Systems Review of Systems 10 Systems reviewed and are negative for acute change except as noted in the HPI.   Physical Exam Updated Vital Signs BP (!) 139/92 (BP Location: Right Arm)    Pulse 69    Temp 98.3 F (36.8 C) (Oral)    Resp 16    Wt (!) 143.7 kg    SpO2 95%   Physical Exam Constitutional:      Comments:  Patient is alert.  He does not have respiratory distress.  He does however intermittently take a large yawn.  Central obesity.  HENT:     Head: Normocephalic and atraumatic.     Mouth/Throat:     Mouth: Mucous membranes are moist.     Pharynx: Oropharynx is clear.  Eyes:     Extraocular Movements: Extraocular movements intact.     Pupils: Pupils are equal, round, and reactive to light.  Cardiovascular:     Rate and Rhythm: Normal rate and regular rhythm.  Pulmonary:     Effort: Pulmonary effort is normal.     Breath sounds: Normal breath sounds.  Abdominal:     General: There is no distension.     Palpations: Abdomen is soft.     Tenderness: There is no abdominal tenderness.  Musculoskeletal: Normal range of motion.     Comments: 1+ edema bilateral ankles.  Skin:    General: Skin is warm and dry.  Neurological:     Comments: Patient seems slightly fatigued but he is alert and appropriate.  He is answering questions appropriately with intelligible speech.  He is following commands for grip strength which is 5\5 bilaterally.  He is excellent strength to elevate and hold  each lower extremity off of the bed.      ED Treatments / Results  Labs (all labs ordered are listed, but only abnormal results are displayed) Labs Reviewed  CBC - Abnormal; Notable for the following components:      Result Value   RBC 6.03 (*)    Hemoglobin 18.3 (*)    HCT 55.5 (*)    RDW 16.8 (*)    All other components within normal limits  DIFFERENTIAL - Abnormal; Notable for the following components:   Lymphs Abs 4.1 (*)    All other components within normal limits  COMPREHENSIVE METABOLIC PANEL - Abnormal; Notable for the following components:   Glucose, Bld 118 (*)    Creatinine, Ser 1.28 (*)    Total Bilirubin 1.8 (*)    All other components within normal limits  I-STAT CHEM 8, ED - Abnormal; Notable for the following components:   BUN 21 (*)    Glucose, Bld 114 (*)    Calcium, Ion 1.10 (*)     Hemoglobin 18.4 (*)    HCT 54.0 (*)    All other components within normal limits  CBG MONITORING, ED - Abnormal; Notable for the following components:   Glucose-Capillary 120 (*)    All other components within normal limits  SARS CORONAVIRUS 2 (TAT 6-24 HRS)  PROTIME-INR  APTT  HIV ANTIBODY (ROUTINE TESTING W REFLEX)  BRAIN NATRIURETIC PEPTIDE  TROPONIN I (HIGH SENSITIVITY)    EKG EKG Interpretation  Date/Time:  Monday June 04 2019 12:51:26 EDT Ventricular Rate:  71 PR Interval:    QRS Duration: 133 QT Interval:  406 QTC Calculation: 442 R Axis:   49 Text Interpretation:  Sinus rhythm Consider left atrial enlargement Right bundle branch block Baseline wander in lead(s) II no STEMI. no old comparsion Confirmed by Arby Barrette [phone]) on 06/04/2019 1:14:50 PM   Radiology Ct Angio Head W Or Wo Contrast  Result Date: 06/04/2019 CLINICAL DATA:  Code stroke.  Left-sided weakness. EXAM: CT ANGIOGRAPHY HEAD AND NECK CT PERFUSION BRAIN TECHNIQUE: Multidetector CT imaging of the head and neck was performed using the standard protocol during bolus administration of intravenous contrast. Multiplanar CT image reconstructions and MIPs were obtained to evaluate the vascular anatomy. Carotid stenosis measurements (when applicable) are obtained utilizing NASCET criteria, using the distal internal carotid diameter as the denominator. Multiphase CT imaging of the brain was performed following IV bolus contrast injection. Subsequent parametric perfusion maps were calculated using RAPID software. CONTRAST:  OMNIPAQUE IOHEXOL 350 MG/ML SOLN COMPARISON:  Head CT earlier same day FINDINGS: CTA NECK FINDINGS Aortic arch: Aortic atherosclerosis. No aneurysm or dissection. Branching pattern is normal. Right carotid system: Common carotid artery widely patent to the bifurcation. Soft and calcified plaque at the carotid bifurcation and ICA bulb but no stenosis. Cervical ICA is tortuous but widely  patent to the skull base. Left carotid system: Common carotid artery widely patent to the bifurcation. Soft and calcified plaque at the bifurcation and ICA bulb. No stenosis. Cervical ICA is tortuous but widely patent to the skull base. Vertebral arteries: Each vertebral artery origin appears widely patent. Both vertebral arteries appear normal through the cervical region to foramen magnum. Skeleton: Ordinary spondylosis. Other neck: No mass or lymphadenopathy. Upper chest: Negative Review of the MIP images confirms the above findings CTA HEAD FINDINGS Anterior circulation: Both internal carotid arteries are patent through the skull base and siphon regions. There is atherosclerotic calcification in the carotid siphon regions but no stenosis greater  than 30%. The anterior and middle cerebral vessels show flow without large or medium vessel occlusion. Distal branch vessels do show atherosclerotic irregularity. Posterior circulation: Both vertebral arteries are patent at the foramen magnum. There is atherosclerotic disease within the right V4 segment with irregular stenosis estimated at 50%. Beyond that the vessel is widely patent to the basilar. No basilar stenosis. Posterior circulation branch vessels remain patent. Venous sinuses: Patent and normal. Anatomic variants: None significant. Review of the MIP images confirms the above findings CT Brain Perfusion Findings: ASPECTS: 10 CBF (<30%) Volume: 0ML Perfusion (Tmax>6.0s) volume: 345mL. I think this reflects poor cardiac output also related to diffuse small vessel disease. Infarction Location:No infarct suspected. IMPRESSION: No large or medium vessel occlusion. Atherosclerotic disease at both carotid bifurcations but without stenosis. Intracranial small vessel atherosclerotic irregularity. Irregular 50% stenosis of the right vertebral artery V4 segment. No other posterior circulation finding. Perfusion study does not suggest any acute insult. There is a large  amount of brain with T-max greater than 6 seconds, but this is felt to reflect diminished cardiac output and small-vessel disease. Electronically Signed   By: Paulina FusiMark  Shogry M.D.   On: 06/04/2019 13:01   Ct Angio Neck W Or Wo Contrast  Result Date: 06/04/2019 CLINICAL DATA:  Code stroke.  Left-sided weakness. EXAM: CT ANGIOGRAPHY HEAD AND NECK CT PERFUSION BRAIN TECHNIQUE: Multidetector CT imaging of the head and neck was performed using the standard protocol during bolus administration of intravenous contrast. Multiplanar CT image reconstructions and MIPs were obtained to evaluate the vascular anatomy. Carotid stenosis measurements (when applicable) are obtained utilizing NASCET criteria, using the distal internal carotid diameter as the denominator. Multiphase CT imaging of the brain was performed following IV bolus contrast injection. Subsequent parametric perfusion maps were calculated using RAPID software. CONTRAST:  100mL OMNIPAQUE IOHEXOL 350 MG/ML SOLN COMPARISON:  Head CT earlier same day FINDINGS: CTA NECK FINDINGS Aortic arch: Aortic atherosclerosis. No aneurysm or dissection. Branching pattern is normal. Right carotid system: Common carotid artery widely patent to the bifurcation. Soft and calcified plaque at the carotid bifurcation and ICA bulb but no stenosis. Cervical ICA is tortuous but widely patent to the skull base. Left carotid system: Common carotid artery widely patent to the bifurcation. Soft and calcified plaque at the bifurcation and ICA bulb. No stenosis. Cervical ICA is tortuous but widely patent to the skull base. Vertebral arteries: Each vertebral artery origin appears widely patent. Both vertebral arteries appear normal through the cervical region to foramen magnum. Skeleton: Ordinary spondylosis. Other neck: No mass or lymphadenopathy. Upper chest: Negative Review of the MIP images confirms the above findings CTA HEAD FINDINGS Anterior circulation: Both internal carotid arteries are  patent through the skull base and siphon regions. There is atherosclerotic calcification in the carotid siphon regions but no stenosis greater than 30%. The anterior and middle cerebral vessels show flow without large or medium vessel occlusion. Distal branch vessels do show atherosclerotic irregularity. Posterior circulation: Both vertebral arteries are patent at the foramen magnum. There is atherosclerotic disease within the right V4 segment with irregular stenosis estimated at 50%. Beyond that the vessel is widely patent to the basilar. No basilar stenosis. Posterior circulation branch vessels remain patent. Venous sinuses: Patent and normal. Anatomic variants: None significant. Review of the MIP images confirms the above findings CT Brain Perfusion Findings: ASPECTS: 10 CBF (<30%) Volume: 0ML Perfusion (Tmax>6.0s) volume: 345mL. I think this reflects poor cardiac output also related to diffuse small vessel disease. Infarction Location:No infarct suspected.  IMPRESSION: No large or medium vessel occlusion. Atherosclerotic disease at both carotid bifurcations but without stenosis. Intracranial small vessel atherosclerotic irregularity. Irregular 50% stenosis of the right vertebral artery V4 segment. No other posterior circulation finding. Perfusion study does not suggest any acute insult. There is a large amount of brain with T-max greater than 6 seconds, but this is felt to reflect diminished cardiac output and small-vessel disease. Electronically Signed   By: Paulina Fusi M.D.   On: 06/04/2019 13:01   Ct Cerebral Perfusion W Contrast  Result Date: 06/04/2019 CLINICAL DATA:  Code stroke.  Left-sided weakness. EXAM: CT ANGIOGRAPHY HEAD AND NECK CT PERFUSION BRAIN TECHNIQUE: Multidetector CT imaging of the head and neck was performed using the standard protocol during bolus administration of intravenous contrast. Multiplanar CT image reconstructions and MIPs were obtained to evaluate the vascular anatomy.  Carotid stenosis measurements (when applicable) are obtained utilizing NASCET criteria, using the distal internal carotid diameter as the denominator. Multiphase CT imaging of the brain was performed following IV bolus contrast injection. Subsequent parametric perfusion maps were calculated using RAPID software. CONTRAST:  OMNIPAQUE IOHEXOL 350 MG/ML SOLN COMPARISON:  Head CT earlier same day FINDINGS: CTA NECK FINDINGS Aortic arch: Aortic atherosclerosis. No aneurysm or dissection. Branching pattern is normal. Right carotid system: Common carotid artery widely patent to the bifurcation. Soft and calcified plaque at the carotid bifurcation and ICA bulb but no stenosis. Cervical ICA is tortuous but widely patent to the skull base. Left carotid system: Common carotid artery widely patent to the bifurcation. Soft and calcified plaque at the bifurcation and ICA bulb. No stenosis. Cervical ICA is tortuous but widely patent to the skull base. Vertebral arteries: Each vertebral artery origin appears widely patent. Both vertebral arteries appear normal through the cervical region to foramen magnum. Skeleton: Ordinary spondylosis. Other neck: No mass or lymphadenopathy. Upper chest: Negative Review of the MIP images confirms the above findings CTA HEAD FINDINGS Anterior circulation: Both internal carotid arteries are patent through the skull base and siphon regions. There is atherosclerotic calcification in the carotid siphon regions but no stenosis greater than 30%. The anterior and middle cerebral vessels show flow without large or medium vessel occlusion. Distal branch vessels do show atherosclerotic irregularity. Posterior circulation: Both vertebral arteries are patent at the foramen magnum. There is atherosclerotic disease within the right V4 segment with irregular stenosis estimated at 50%. Beyond that the vessel is widely patent to the basilar. No basilar stenosis. Posterior circulation branch vessels remain  patent. Venous sinuses: Patent and normal. Anatomic variants: None significant. Review of the MIP images confirms the above findings CT Brain Perfusion Findings: ASPECTS: 10 CBF (<30%) Volume: Perfusion (Tmax>6.0s) volume: . I think this reflects poor cardiac output also related to diffuse small vessel disease. Infarction Location:No infarct suspected. IMPRESSION: No large or medium vessel occlusion. Atherosclerotic disease at both carotid bifurcations but without stenosis. Intracranial small vessel atherosclerotic irregularity. Irregular 50% stenosis of the right vertebral artery V4 segment. No other posterior circulation finding. Perfusion study does not suggest any acute insult. There is a large amount of brain with T-max greater than 6 seconds, but this is felt to reflect diminished cardiac output and small-vessel disease. Electronically Signed   By: Paulina Fusi M.D.   On: 06/04/2019 13:01   Ct Head Code Stroke Wo Contrast  Result Date: 06/04/2019 CLINICAL DATA:  Code stroke. Aphasia. Left-sided weakness. Last seen normal 1.5 hours ago. EXAM: CT HEAD WITHOUT CONTRAST TECHNIQUE: Contiguous axial images were obtained  from the base of the skull through the vertex without intravenous contrast. COMPARISON:  None. FINDINGS: Brain: No acute cortical infarct is present. Basal ganglia are intact. Insular ribbon is normal. The ventricles are of normal size. No significant extraaxial fluid collection is present. The brainstem and cerebellum are within normal limits. Vascular: Atherosclerotic calcifications are present within the cavernous internal carotid arteries and at the dural margin of both vertebral arteries. Vessels are mildly dense throughout. There is no significant asymmetry. Skull: Calvarium is intact. No focal lytic or blastic lesions are present. Sinuses/Orbits: The paranasal sinuses and mastoid air cells are clear. The globes and orbits are within normal limits. ASPECTS [facility] Stroke Program  Early CT Score) - Ganglionic level infarction (caudate, lentiform nuclei, internal capsule, insula, M1-M3 cortex): 7/7 - Supraganglionic infarction (M4-M6 cortex): 3/3 Total score (0-10 with 10 being normal): 10/10 IMPRESSION: 1. Normal CT appearance of the brain.  No acute infarct evident. 2. Atherosclerotic changes noted within the cavernous internal carotid arteries and at the dural margin of the vertebral arteries bilaterally without asymmetric hyperdense vessel 3. ASPECTS is 10/10 The above was relayed via text pager to Dr. Lebron Conners Zamzam Whinery on 06/04/2019 at 12:37 . Electronically Signed   By: Marin Roberts M.D.   On: 06/04/2019 12:38    Procedures Procedures (including critical care time) CRITICAL CARE Performed by: Arby Barrette   Total critical care time: 15 minutes  Critical care time was exclusive of separately billable procedures and treating other patients.  Critical care was necessary to treat or prevent imminent or life-threatening deterioration.  Critical care was time spent personally by me on the following activities: development of treatment plan with patient and/or surrogate as well as nursing, discussions with consultants, evaluation of patient's response to treatment, examination of patient, obtaining history from patient or surrogate, ordering and performing treatments and interventions, ordering and review of laboratory studies, ordering and review of radiographic studies, pulse oximetry and re-evaluation of patient's condition.Medications Ordered in ED Medications  sodium chloride flush (NS) 0.9 % injection 3 mL (3 mLs Intravenous Not Given 06/04/19 1247)  clevidipine (CLEVIPREX) infusion 0.5 mg/mL (0 mg/hr Intravenous Hold 06/04/19 1254)  alteplase (ACTIVASE) 1 mg/mL infusion 90 mg (90 mg Intravenous New Bag/Given 06/04/19 1210)    Followed by  0.9 %  sodium chloride infusion (has no administration in time range)  clevidipine (CLEVIPREX) 0.5 MG/ML infusion (has no  administration in time range)   stroke: mapping our early stages of recovery book (has no administration in time range)  0.9 %  sodium chloride infusion (has no administration in time range)  acetaminophen (TYLENOL) tablet 650 mg (has no administration in time range)    Or  acetaminophen (TYLENOL) solution 650 mg (has no administration in time range)    Or  acetaminophen (TYLENOL) suppository 650 mg (has no administration in time range)  senna-docusate (Senokot-S) tablet 1 tablet (has no administration in time range)  pantoprazole (PROTONIX) injection 40 mg (has no administration in time range)  ondansetron (ZOFRAN) injection 4 mg (has no administration in time range)  iohexol (OMNIPAQUE) 350 MG/ML injection 100 mL (100 mLs Intravenous Contrast Given 06/04/19 1218)     Initial Impression / Assessment and Plan / ED Course  I have reviewed the triage vital signs and the nursing notes.  Pertinent labs & imaging results that were available during my care of the patient were reviewed by me and considered in my medical decision making (see chart for details).  Patient arrives as code stroke.  Neurology initiated TPA while in radiology suite.  I have examined the patient after TPA initiated.  Patient is improving.  At this time his speech is intelligible and oriented.  He is following commands.  Patient does have nausea.  He started to feel slightly short of breath.  We will proceed with some Zofran and obtain chest x-ray and troponin.  EKG does not show STEMI or acutely ischemic appearing pattern.  Patient will be admitted to neuro ICU.  Final Clinical Impressions(s) / ED Diagnoses   Final diagnoses:  Acute CVA (cerebrovascular accident) [facility](HCC)    ED Discharge Orders    None       Arby BarrettePfeiffer, Blakeley Margraf, MD 06/04/19 1318

## 2019-06-04 NOTE — Progress Notes (Signed)
Echocardiogram 2D Echocardiogram has been performed.  Brian Randall Brian Randall 06/04/2019, 2:07 PM

## 2019-06-05 ENCOUNTER — Inpatient Hospital Stay (HOSPITAL_COMMUNITY): Payer: BC Managed Care – PPO

## 2019-06-05 ENCOUNTER — Encounter (HOSPITAL_COMMUNITY): Payer: Self-pay | Admitting: *Deleted

## 2019-06-05 DIAGNOSIS — G4733 Obstructive sleep apnea (adult) (pediatric): Secondary | ICD-10-CM

## 2019-06-05 DIAGNOSIS — G43109 Migraine with aura, not intractable, without status migrainosus: Secondary | ICD-10-CM

## 2019-06-05 DIAGNOSIS — Z9989 Dependence on other enabling machines and devices: Secondary | ICD-10-CM

## 2019-06-05 DIAGNOSIS — G459 Transient cerebral ischemic attack, unspecified: Principal | ICD-10-CM

## 2019-06-05 DIAGNOSIS — I639 Cerebral infarction, unspecified: Secondary | ICD-10-CM

## 2019-06-05 DIAGNOSIS — D751 Secondary polycythemia: Secondary | ICD-10-CM

## 2019-06-05 LAB — BASIC METABOLIC PANEL
Anion gap: 8 (ref 5–15)
BUN: 14 mg/dL (ref 6–20)
CO2: 28 mmol/L (ref 22–32)
Calcium: 9 mg/dL (ref 8.9–10.3)
Chloride: 103 mmol/L (ref 98–111)
Creatinine, Ser: 1.07 mg/dL (ref 0.61–1.24)
GFR calc Af Amer: >60 mL/min (ref >60)
GFR calc non Af Amer: >60 mL/min (ref >60)
Glucose, Bld: 111 mg/dL — ABNORMAL HIGH (ref 70–99)
Potassium: 4.6 mmol/L (ref 3.5–5.1)
Sodium: 139 mmol/L (ref 135–145)

## 2019-06-05 LAB — CBC
HCT: 56.2 % — ABNORMAL HIGH (ref 39.0–52.0)
Hemoglobin: 17.6 g/dL — ABNORMAL HIGH (ref 13.0–17.0)
MCH: 28.9 pg (ref 26.0–34.0)
MCHC: 31.3 g/dL (ref 30.0–36.0)
MCV: 92.4 fL (ref 80.0–100.0)
Platelets: 164 10*3/uL (ref 150–400)
RBC: 6.08 MIL/uL — ABNORMAL HIGH (ref 4.22–5.81)
RDW: 16.5 % — ABNORMAL HIGH (ref 11.5–15.5)
WBC: 8.5 10*3/uL (ref 4.0–10.5)
nRBC: 0 % (ref 0.0–0.2)

## 2019-06-05 LAB — TROPONIN I (HIGH SENSITIVITY)
Troponin I (High Sensitivity): 5 ng/L (ref <18)
Troponin I (High Sensitivity): 5 ng/L (ref <18)

## 2019-06-05 MED ORDER — ASPIRIN EC 81 MG PO TBEC
81.0000 mg | DELAYED_RELEASE_TABLET | Freq: Every day | ORAL | Status: DC
  Administered 2019-06-05: 14:00:00 81 mg via ORAL
  Filled 2019-06-05: qty 1

## 2019-06-05 MED ORDER — ATORVASTATIN CALCIUM 40 MG PO TABS
40.0000 mg | ORAL_TABLET | Freq: Every day | ORAL | Status: DC
  Administered 2019-06-05: 40 mg via ORAL
  Filled 2019-06-05: qty 1

## 2019-06-05 MED ORDER — CLOPIDOGREL BISULFATE 75 MG PO TABS
75.0000 mg | ORAL_TABLET | Freq: Every day | ORAL | Status: DC
  Administered 2019-06-05: 75 mg via ORAL
  Filled 2019-06-05: qty 1

## 2019-06-05 MED ORDER — LORAZEPAM 1 MG PO TABS
2.0000 mg | ORAL_TABLET | Freq: Once | ORAL | Status: AC
  Administered 2019-06-05: 2 mg via ORAL
  Filled 2019-06-05: qty 2

## 2019-06-05 MED ORDER — SODIUM CHLORIDE 0.9 % IV SOLN
INTRAVENOUS | Status: DC
  Administered 2019-06-05: 21:00:00 via INTRAVENOUS

## 2019-06-05 NOTE — Progress Notes (Addendum)
STROKE TEAM PROGRESS NOTE   INTERVAL HISTORY Pt sitting in bed for lunch, wife at bedside. Pt now symptoms free. MRI negative for stroke. He stated that he had migraine when he was young.  However no headache since until 2 weeks ago he started have headache again.  Last Sunday and Saturday he had bad headache.  However, when he had an episode yesterday he had no headache.  Vitals:   06/05/19 0458 06/05/19 0534 06/05/19 0600 06/05/19 0700  BP:  (!) 102/58 (!) 93/57   Pulse:  68 (!) 58   Resp:  18 16   Temp: 98.2 F (36.8 C)   97.7 F (36.5 C)  TempSrc: Oral   Oral  SpO2:  99% 93%   Weight:        CBC:  Recent Labs  Lab 06/04/19 1153 06/04/19 1202  WBC 10.2  --   NEUTROABS 5.0  --   HGB 18.3* 18.4*  HCT 55.5* 54.0*  MCV 92.0  --   PLT 184  --     Basic Metabolic Panel:  Recent Labs  Lab 06/04/19 1153 06/04/19 1202  NA 139 140  K 4.5 4.3  CL 102 100  CO2 27  --   GLUCOSE 118* 114*  BUN 18 21*  CREATININE 1.28* 1.20  CALCIUM 9.5  --    Lipid Panel: No results found for: CHOL, TRIG, HDL, CHOLHDL, VLDL, LDLCALC HgbA1c: No results found for: HGBA1C Urine Drug Screen: No results found for: LABOPIA, COCAINSCRNUR, LABBENZ, AMPHETMU, THCU, LABBARB  Alcohol Level No results found for: ETH  IMAGING Ct Angio Head W Or Wo Contrast  Result Date: 06/04/2019 CLINICAL DATA:  Code stroke.  Left-sided weakness. EXAM: CT ANGIOGRAPHY HEAD AND NECK CT PERFUSION BRAIN TECHNIQUE: Multidetector CT imaging of the head and neck was performed using the standard protocol during bolus administration of intravenous contrast. Multiplanar CT image reconstructions and MIPs were obtained to evaluate the vascular anatomy. Carotid stenosis measurements (when applicable) are obtained utilizing NASCET criteria, using the distal internal carotid diameter as the denominator. Multiphase CT imaging of the brain was performed following IV bolus contrast injection. Subsequent parametric perfusion maps were  calculated using RAPID software. CONTRAST:  OMNIPAQUE IOHEXOL 350 MG/ML SOLN COMPARISON:  Head CT earlier same day FINDINGS: CTA NECK FINDINGS Aortic arch: Aortic atherosclerosis. No aneurysm or dissection. Branching pattern is normal. Right carotid system: Common carotid artery widely patent to the bifurcation. Soft and calcified plaque at the carotid bifurcation and ICA bulb but no stenosis. Cervical ICA is tortuous but widely patent to the skull base. Left carotid system: Common carotid artery widely patent to the bifurcation. Soft and calcified plaque at the bifurcation and ICA bulb. No stenosis. Cervical ICA is tortuous but widely patent to the skull base. Vertebral arteries: Each vertebral artery origin appears widely patent. Both vertebral arteries appear normal through the cervical region to foramen magnum. Skeleton: Ordinary spondylosis. Other neck: No mass or lymphadenopathy. Upper chest: Negative Review of the MIP images confirms the above findings CTA HEAD FINDINGS Anterior circulation: Both internal carotid arteries are patent through the skull base and siphon regions. There is atherosclerotic calcification in the carotid siphon regions but no stenosis greater than 30%. The anterior and middle cerebral vessels show flow without large or medium vessel occlusion. Distal branch vessels do show atherosclerotic irregularity. Posterior circulation: Both vertebral arteries are patent at the foramen magnum. There is atherosclerotic disease within the right V4 segment with irregular stenosis estimated at 50%. Beyond  that the vessel is widely patent to the basilar. No basilar stenosis. Posterior circulation branch vessels remain patent. Venous sinuses: Patent and normal. Anatomic variants: None significant. Review of the MIP images confirms the above findings CT Brain Perfusion Findings: ASPECTS: 10 CBF (<30%) Volume: Perfusion (Tmax>6.0s) volume: . I think this reflects poor cardiac output also  related to diffuse small vessel disease. Infarction Location:No infarct suspected. IMPRESSION: No large or medium vessel occlusion. Atherosclerotic disease at both carotid bifurcations but without stenosis. Intracranial small vessel atherosclerotic irregularity. Irregular 50% stenosis of the right vertebral artery V4 segment. No other posterior circulation finding. Perfusion study does not suggest any acute insult. There is a large amount of brain with T-max greater than 6 seconds, but this is felt to reflect diminished cardiac output and small-vessel disease. Electronically Signed   By: Paulina Fusi M.D.   On: 06/04/2019 13:01   Ct Angio Neck W Or Wo Contrast  Result Date: 06/04/2019 CLINICAL DATA:  Code stroke.  Left-sided weakness. EXAM: CT ANGIOGRAPHY HEAD AND NECK CT PERFUSION BRAIN TECHNIQUE: Multidetector CT imaging of the head and neck was performed using the standard protocol during bolus administration of intravenous contrast. Multiplanar CT image reconstructions and MIPs were obtained to evaluate the vascular anatomy. Carotid stenosis measurements (when applicable) are obtained utilizing NASCET criteria, using the distal internal carotid diameter as the denominator. Multiphase CT imaging of the brain was performed following IV bolus contrast injection. Subsequent parametric perfusion maps were calculated using RAPID software. CONTRAST:  OMNIPAQUE IOHEXOL 350 MG/ML SOLN COMPARISON:  Head CT earlier same day FINDINGS: CTA NECK FINDINGS Aortic arch: Aortic atherosclerosis. No aneurysm or dissection. Branching pattern is normal. Right carotid system: Common carotid artery widely patent to the bifurcation. Soft and calcified plaque at the carotid bifurcation and ICA bulb but no stenosis. Cervical ICA is tortuous but widely patent to the skull base. Left carotid system: Common carotid artery widely patent to the bifurcation. Soft and calcified plaque at the bifurcation and ICA bulb. No stenosis.  Cervical ICA is tortuous but widely patent to the skull base. Vertebral arteries: Each vertebral artery origin appears widely patent. Both vertebral arteries appear normal through the cervical region to foramen magnum. Skeleton: Ordinary spondylosis. Other neck: No mass or lymphadenopathy. Upper chest: Negative Review of the MIP images confirms the above findings CTA HEAD FINDINGS Anterior circulation: Both internal carotid arteries are patent through the skull base and siphon regions. There is atherosclerotic calcification in the carotid siphon regions but no stenosis greater than 30%. The anterior and middle cerebral vessels show flow without large or medium vessel occlusion. Distal branch vessels do show atherosclerotic irregularity. Posterior circulation: Both vertebral arteries are patent at the foramen magnum. There is atherosclerotic disease within the right V4 segment with irregular stenosis estimated at 50%. Beyond that the vessel is widely patent to the basilar. No basilar stenosis. Posterior circulation branch vessels remain patent. Venous sinuses: Patent and normal. Anatomic variants: None significant. Review of the MIP images confirms the above findings CT Brain Perfusion Findings: ASPECTS: 10 CBF (<30%) Volume: Perfusion (Tmax>6.0s) volume: . I think this reflects poor cardiac output also related to diffuse small vessel disease. Infarction Location:No infarct suspected. IMPRESSION: No large or medium vessel occlusion. Atherosclerotic disease at both carotid bifurcations but without stenosis. Intracranial small vessel atherosclerotic irregularity. Irregular 50% stenosis of the right vertebral artery V4 segment. No other posterior circulation finding. Perfusion study does not suggest any acute insult. There is a large amount of  brain with T-max greater than 6 seconds, but this is felt to reflect diminished cardiac output and small-vessel disease. Electronically Signed   By: Paulina Fusi M.D.    On: 06/04/2019 13:01   Ct Cerebral Perfusion W Contrast  Result Date: 06/04/2019 CLINICAL DATA:  Code stroke.  Left-sided weakness. EXAM: CT ANGIOGRAPHY HEAD AND NECK CT PERFUSION BRAIN TECHNIQUE: Multidetector CT imaging of the head and neck was performed using the standard protocol during bolus administration of intravenous contrast. Multiplanar CT image reconstructions and MIPs were obtained to evaluate the vascular anatomy. Carotid stenosis measurements (when applicable) are obtained utilizing NASCET criteria, using the distal internal carotid diameter as the denominator. Multiphase CT imaging of the brain was performed following IV bolus contrast injection. Subsequent parametric perfusion maps were calculated using RAPID software. CONTRAST:  OMNIPAQUE IOHEXOL 350 MG/ML SOLN COMPARISON:  Head CT earlier same day FINDINGS: CTA NECK FINDINGS Aortic arch: Aortic atherosclerosis. No aneurysm or dissection. Branching pattern is normal. Right carotid system: Common carotid artery widely patent to the bifurcation. Soft and calcified plaque at the carotid bifurcation and ICA bulb but no stenosis. Cervical ICA is tortuous but widely patent to the skull base. Left carotid system: Common carotid artery widely patent to the bifurcation. Soft and calcified plaque at the bifurcation and ICA bulb. No stenosis. Cervical ICA is tortuous but widely patent to the skull base. Vertebral arteries: Each vertebral artery origin appears widely patent. Both vertebral arteries appear normal through the cervical region to foramen magnum. Skeleton: Ordinary spondylosis. Other neck: No mass or lymphadenopathy. Upper chest: Negative Review of the MIP images confirms the above findings CTA HEAD FINDINGS Anterior circulation: Both internal carotid arteries are patent through the skull base and siphon regions. There is atherosclerotic calcification in the carotid siphon regions but no stenosis greater than 30%. The anterior and middle  cerebral vessels show flow without large or medium vessel occlusion. Distal branch vessels do show atherosclerotic irregularity. Posterior circulation: Both vertebral arteries are patent at the foramen magnum. There is atherosclerotic disease within the right V4 segment with irregular stenosis estimated at 50%. Beyond that the vessel is widely patent to the basilar. No basilar stenosis. Posterior circulation branch vessels remain patent. Venous sinuses: Patent and normal. Anatomic variants: None significant. Review of the MIP images confirms the above findings CT Brain Perfusion Findings: ASPECTS: 10 CBF (<30%) Volume: Perfusion (Tmax>6.0s) volume: . I think this reflects poor cardiac output also related to diffuse small vessel disease. Infarction Location:No infarct suspected. IMPRESSION: No large or medium vessel occlusion. Atherosclerotic disease at both carotid bifurcations but without stenosis. Intracranial small vessel atherosclerotic irregularity. Irregular 50% stenosis of the right vertebral artery V4 segment. No other posterior circulation finding. Perfusion study does not suggest any acute insult. There is a large amount of brain with T-max greater than 6 seconds, but this is felt to reflect diminished cardiac output and small-vessel disease. Electronically Signed   By: Paulina Fusi M.D.   On: 06/04/2019 13:01   Dg Chest Port 1 View  Result Date: 06/04/2019 CLINICAL DATA:  Shortness of breath. EXAM: PORTABLE CHEST 1 VIEW COMPARISON:  None. FINDINGS: There is minimal left basilar atelectasis. Lungs otherwise clear. Heart size is normal. Atherosclerosis noted. No pneumothorax or pleural fluid. No acute or focal bony abnormality. IMPRESSION: No acute disease. Atherosclerosis. Electronically Signed   By: Drusilla Kanner M.D.   On: 06/04/2019 15:00   Ct Head Code Stroke Wo Contrast  Result Date: 06/04/2019 CLINICAL DATA:  Code  stroke. Aphasia. Left-sided weakness. Last seen normal 1.5 hours  ago. EXAM: CT HEAD WITHOUT CONTRAST TECHNIQUE: Contiguous axial images were obtained from the base of the skull through the vertex without intravenous contrast. COMPARISON:  None. FINDINGS: Brain: No acute cortical infarct is present. Basal ganglia are intact. Insular ribbon is normal. The ventricles are of normal size. No significant extraaxial fluid collection is present. The brainstem and cerebellum are within normal limits. Vascular: Atherosclerotic calcifications are present within the cavernous internal carotid arteries and at the dural margin of both vertebral arteries. Vessels are mildly dense throughout. There is no significant asymmetry. Skull: Calvarium is intact. No focal lytic or blastic lesions are present. Sinuses/Orbits: The paranasal sinuses and mastoid air cells are clear. The globes and orbits are within normal limits. ASPECTS [facility]([place] Stroke Program Early CT Score) - Ganglionic level infarction (caudate, lentiform nuclei, internal capsule, insula, M1-M3 cortex): 7/7 - Supraganglionic infarction (M4-M6 cortex): 3/3 Total score (0-10 with 10 being normal): 10/10 IMPRESSION: 1. Normal CT appearance of the brain.  No acute infarct evident. 2. Atherosclerotic changes noted within the cavernous internal carotid arteries and at the dural margin of the vertebral arteries bilaterally without asymmetric hyperdense vessel 3. ASPECTS is 10/10 The above was relayed via text pager to Dr. Lebron ConnersMARCY PFEIFFER on 06/04/2019 at 12:37 . Electronically Signed   By: Marin Robertshristopher  Mattern M.D.   On: 06/04/2019 12:38    PHYSICAL EXAM    General - obese, well developed, in no apparent distress.    Ophthalmologic - fundi not visualized due to noncooperation.    Cardiovascular - regular rate and rhythm  Mental Status -  Awake alert. However, partial global aphasia, not following simple commands. Able to tell me his name but not able to answer other questions, able to cooperative on visual field testing but not  able to follow commands on the hand or foot.   Cranial Nerves II - XII - II - visual field full III, IV, VI - Extraocular movements intact V - Facial sensation intact bilaterally. VII - facial symmetric VIII - Hearing & vestibular intact bilaterally. X - Palate elevates symmetrically. XI - Chin turning & shoulder shrug intact bilaterally. XII - Tongue protrusion intact.  Motor Strength - The patient's strength was normal in all extremities and pronator drift was absent.   Motor Tone & Bulk - Muscle tone was assessed at the neck and appendages and was normal.  Bulk was normal and fasciculations were absent.   Reflexes - The patient's reflexes were normal in all extremities and he had no pathological reflexes.  Sensory - Light touch, temperature/pinprick were not cooperative.    Coordination - not cooperative on exam.  Tremor was absent.  Gait and Station - deferred  ASSESSMENT/PLAN Mr. Anice PaganiniGarry Bryan Scarola is a 58 y.o. male with history of HTN, Hypothyroidism, hiatal hernia, BPH, dysphagia with esophageal polyp presenting with global aphasia and right lower quadrantanopia. Received tPA 06/04/2019 at 1210.    L brain TIA s/p tPA. However, migraine equivalent can not be ruled out   Code Stroke CT head No acute abnormality. Atherosclerotic changed B ICAs and VAs.  ASPECTS 10.     CTA head & neck no ELVO. B ICA bifurcation atherosclerosis. Intracranial small vessel disease. R VA 50% irreg stenosis.   CT perfusion no acute infarct  MRI normal  LE Doppler no DVT  2D Echo EF 60-65%. No source of embolus   TEE to look for embolic source[facility]  (I have made patient NPO after midnight tonight).  If TEE negative, a [place] electrophysiologist will consult and consider placement of an implantable loop recorder to evaluate for atrial fibrillation as etiology of stroke. This has been explained to  patient/family by Dr. Erlinda Hong and they are agreeable.   HIV pending   LDL pending  HgbA1c pending  SCDs for VTE prophylaxis  No antithrombotic prior to admission, now on aspirin 81 and Plavix 75 DAPT for 3 weeks and then aspirin alone.  Therapy recommendations: None  Disposition:  pending   Migraine  Patient had migraine headache when he was young, no complicated migraine  Headache recurrent from 2 weeks ago  Current episode without headache  Migraine equivalent cannot be completely ruled out at this time  Hypertension  Home meds:  None listed BP stable . Long-term BP goal normotensive  Polycythemia  Hemoglobin 18.4-17.6  Likely related to OSA and recent testosterone use  Continue monitoring  Other Stroke Risk Factors  OSA - on CPAP at home  Other Active Problems  BPH on flomax at home  Hypothyroid on synthroid at home  Recent esophageal dilatation w/ benign polyp   Hospital day # 1  This patient is critically ill due to stroke symptoms s/p tPA, hypertensive urgency and at significant risk of neurological worsening, death form hemorrhagic conversion, seizure, bleeding. This patient's care requires constant monitoring of vital signs, hemodynamics, respiratory and cardiac monitoring, review of multiple databases, neurological assessment, discussion with family, other specialists and medical decision making of high complexity. I spent 30 minutes of neurocritical care time in the care of this patient.  Rosalin Hawking, MD PhD Stroke Neurology 06/05/2019 7:24 PM    To contact Stroke Continuity provider, please refer to [email or web address] After hours, contact General Neurology

## 2019-06-05 NOTE — Progress Notes (Addendum)
Pt has bed on 3W-07. Attempted to call report, was told RN unable to receive report at this time. Provided name and number for call back.  Attempted to contact pts spouse to notify of transfer, however unable to reach or leave vm.

## 2019-06-05 NOTE — Progress Notes (Signed)
    [facility] has been requested to perform a transesophageal echocardiogram on Brian Randall for CVA.  After careful review of history and examination, the risks and benefits of transesophageal echocardiogram have been explained including risks of esophageal damage, perforation (1:10,000 risk), bleeding, pharyngeal hematoma as well as other potential complications associated with conscious sedation including aspiration, arrhythmia, respiratory failure and death. Alternatives to treatment were discussed, questions were answered. Patient is willing to proceed.   Lyda Jester, PA-C  06/05/2019 5:16 PM

## 2019-06-05 NOTE — Evaluation (Signed)
Physical Therapy Evaluation Patient Details Name: Brian Randall MRN: [id number] DOB: 1961/09/07 Today's Date: 06/05/2019   History of Present Illness  Brian Randall is 58 yo male with PMH of HTN,hypothyroidism,HH,BPH,recent esophogeal stretch admitted 9/21 with stroke, difficulty speaking, left side weakness. Patient given tpa.  Clinical Impression  The patient presents with no obvious weakness of legs/trunk. Ambulated x 60' without assistance, no balance loss. Patient will benefit from PT while in acute to ensure continued safty during gait without any assistance.     Follow Up Recommendations No PT follow up    Equipment Recommendations  None recommended by PT    Recommendations for Other Services       Precautions / Restrictions Precautions Precautions: Fall      Mobility  Bed Mobility Overal bed mobility: Independent                Transfers Overall transfer level: Needs assistance Equipment used: None Transfers: Sit to/from Stand Sit to Stand: Supervision         General transfer comment: safety, tends to be impulsive  Ambulation/Gait Ambulation/Gait assistance: Supervision Gait Distance (Feet): 60 Feet Assistive device: None Gait Pattern/deviations: WFL(Within Functional Limits)        Stairs            Wheelchair Mobility    Modified Rankin (Stroke Patients Only)       Balance Overall balance assessment: No apparent balance deficits (not formally assessed)                                           Pertinent Vitals/Pain      Home Living Family/patient expects to be discharged to:: Private residence Living Arrangements: Spouse/significant other Available Help at Discharge: Family Type of Home: House Home Access: Stairs to enter   Technical brewer of Steps: 1 Home Layout: Two level;Able to live on main level with bedroom/bathroom Home Equipment: None      Prior Function Level of Independence:  Independent         Comments: works with heavy equipment, grading.     Hand Dominance        Extremity/Trunk Assessment   Upper Extremity Assessment Upper Extremity Assessment: Defer to OT evaluation    Lower Extremity Assessment Lower Extremity Assessment: Overall WFL for tasks assessed    Cervical / Trunk Assessment Cervical / Trunk Assessment: Normal  Communication   Communication: No difficulties  Cognition Arousal/Alertness: Awake/alert Behavior During Therapy: Impulsive Overall Cognitive Status: Within Functional Limits for tasks assessed                                 General Comments: cues for safety and to slow down      General Comments      Exercises     Assessment/Plan    PT Assessment Patient needs continued PT services  PT Problem List Decreased activity tolerance       PT Treatment Interventions Gait training    PT Goals (Current goals can be found in the Care Plan section)  Acute Rehab PT Goals Time For Goal Achievement: 06/12/19 Potential to Achieve Goals: Good    Frequency Min 3X/week   Barriers to discharge        Co-evaluation  AM-PAC PT "6 Clicks" Mobility  Outcome Measure Help needed turning from your back to your side while in a flat bed without using bedrails?: None Help needed moving from lying on your back to sitting on the side of a flat bed without using bedrails?: None Help needed moving to and from a bed to a chair (including a wheelchair)?: A Little Help needed standing up from a chair using your arms (e.g., wheelchair or bedside chair)?: A Little Help needed to walk in hospital room?: A Little Help needed climbing 3-5 steps with a railing? : A Little 6 Click Score: 20    End of Session   Activity Tolerance: Patient tolerated treatment well Patient left: in bed;with bed alarm set;with call bell/phone within reach;with family/visitor present Nurse Communication: Mobility  status PT Visit Diagnosis: Unsteadiness on feet (R26.81)    Time: 1610-9604 PT Time Calculation (min) (ACUTE ONLY): 25 min   Charges:   PT Evaluation $PT Eval Low Complexity: 1 Low PT Treatments $Gait Training: 8-22 mins        Blanchard Kelch PT Acute Rehabilitation Services Pager [phone] Office [phone]   Rada Hay 06/05/2019, 10:41 AM

## 2019-06-05 NOTE — Progress Notes (Signed)
Spoke w/ pts dtr to provide updates. Pts dtr appreciative.  

## 2019-06-05 NOTE — Progress Notes (Signed)
RT brought and setup CPAP dream station for pt. Pt states if I set it up he can place himself on it without any problem. RT demonstrated how this equipment worked, and how to turn machine on. Pt able to teach back to RT without difficulty. Pt machine is on auto titrate 15 max 5 min with no O2 bled into the system. RT will continue to monitor.

## 2019-06-05 NOTE — Progress Notes (Signed)
Report given to [place], receiving RN.  Pt to be transported by wheelchair. Spoke w/ pts dtr and relayed pts new unit.  Pts dtr appreciative.

## 2019-06-05 NOTE — Progress Notes (Signed)
LE venous duplex       has been completed. Preliminary results can be found under CV proc through chart review. Edris Friedt, BS, RDMS, RVT   

## 2019-06-06 ENCOUNTER — Other Ambulatory Visit (HOSPITAL_COMMUNITY): Payer: BC Managed Care – PPO

## 2019-06-06 ENCOUNTER — Encounter (HOSPITAL_COMMUNITY)
Admission: EM | Disposition: A | Discharge status: Home / Self Care | Payer: Self-pay | Source: Home / Self Care | Attending: Neurology

## 2019-06-06 ENCOUNTER — Encounter (HOSPITAL_COMMUNITY): Payer: Self-pay | Admitting: Anesthesiology

## 2019-06-06 ENCOUNTER — Encounter (HOSPITAL_COMMUNITY): Payer: Self-pay | Admitting: Physician Assistant

## 2019-06-06 DIAGNOSIS — Z9989 Dependence on other enabling machines and devices: Secondary | ICD-10-CM

## 2019-06-06 DIAGNOSIS — G43009 Migraine without aura, not intractable, without status migrainosus: Secondary | ICD-10-CM

## 2019-06-06 DIAGNOSIS — N400 Enlarged prostate without lower urinary tract symptoms: Secondary | ICD-10-CM | POA: Diagnosis present

## 2019-06-06 DIAGNOSIS — G4733 Obstructive sleep apnea (adult) (pediatric): Secondary | ICD-10-CM

## 2019-06-06 DIAGNOSIS — G43909 Migraine, unspecified, not intractable, without status migrainosus: Secondary | ICD-10-CM | POA: Diagnosis present

## 2019-06-06 DIAGNOSIS — E039 Hypothyroidism, unspecified: Secondary | ICD-10-CM | POA: Diagnosis present

## 2019-06-06 DIAGNOSIS — I6389 Other cerebral infarction: Secondary | ICD-10-CM

## 2019-06-06 DIAGNOSIS — I10 Essential (primary) hypertension: Secondary | ICD-10-CM | POA: Diagnosis present

## 2019-06-06 DIAGNOSIS — K222 Esophageal obstruction: Secondary | ICD-10-CM

## 2019-06-06 DIAGNOSIS — D751 Secondary polycythemia: Secondary | ICD-10-CM | POA: Diagnosis present

## 2019-06-06 HISTORY — PX: LOOP RECORDER INSERTION: EP1214

## 2019-06-06 LAB — HEMOGLOBIN A1C
Hgb A1c MFr Bld: 5.3 % (ref 4.8–5.6)
Mean Plasma Glucose: 105 mg/dL

## 2019-06-06 LAB — BASIC METABOLIC PANEL
Anion gap: 8 (ref 5–15)
BUN: 16 mg/dL (ref 6–20)
CO2: 27 mmol/L (ref 22–32)
Calcium: 8.7 mg/dL — ABNORMAL LOW (ref 8.9–10.3)
Chloride: 101 mmol/L (ref 98–111)
Creatinine, Ser: 1.02 mg/dL (ref 0.61–1.24)
GFR calc Af Amer: >60 mL/min (ref >60)
GFR calc non Af Amer: >60 mL/min (ref >60)
Glucose, Bld: 88 mg/dL (ref 70–99)
Potassium: 4.4 mmol/L (ref 3.5–5.1)
Sodium: 136 mmol/L (ref 135–145)

## 2019-06-06 LAB — LIPID PANEL
Cholesterol: 190 mg/dL (ref 0–200)
HDL: 35 mg/dL — ABNORMAL LOW (ref >40)
LDL Cholesterol: 134 mg/dL — ABNORMAL HIGH (ref 0–99)
Total CHOL/HDL Ratio: 5.4 RATIO
Triglycerides: 104 mg/dL (ref <150)
VLDL: 21 mg/dL (ref 0–40)

## 2019-06-06 LAB — CBC
HCT: 58 % — ABNORMAL HIGH (ref 39.0–52.0)
Hemoglobin: 18.4 g/dL — ABNORMAL HIGH (ref 13.0–17.0)
MCH: 29.6 pg (ref 26.0–34.0)
MCHC: 31.7 g/dL (ref 30.0–36.0)
MCV: 93.4 fL (ref 80.0–100.0)
Platelets: 155 10*3/uL (ref 150–400)
RBC: 6.21 MIL/uL — ABNORMAL HIGH (ref 4.22–5.81)
RDW: 17.2 % — ABNORMAL HIGH (ref 11.5–15.5)
WBC: 8.4 10*3/uL (ref 4.0–10.5)
nRBC: 0 % (ref 0.0–0.2)

## 2019-06-06 LAB — HIV ANTIBODY (ROUTINE TESTING W REFLEX): HIV Screen 4th Generation wRfx: NONREACTIVE

## 2019-06-06 LAB — TSH: TSH: 4.798 u[IU]/mL — ABNORMAL HIGH (ref 0.350–4.500)

## 2019-06-06 LAB — BRAIN NATRIURETIC PEPTIDE: B Natriuretic Peptide: 79.1 pg/mL (ref 0.0–100.0)

## 2019-06-06 SURGERY — LOOP RECORDER INSERTION

## 2019-06-06 SURGERY — CANCELLED PROCEDURE

## 2019-06-06 MED ORDER — SENNOSIDES-DOCUSATE SODIUM 8.6-50 MG PO TABS: 1.0000 | ORAL_TABLET | Freq: Every evening | ORAL | Status: DC | PRN

## 2019-06-06 MED ORDER — CLOPIDOGREL BISULFATE 75 MG PO TABS: 75.0000 mg | ORAL_TABLET | Freq: Every day | ORAL | 0 refills | Status: DC

## 2019-06-06 MED ORDER — LIDOCAINE-EPINEPHRINE 1 %-1:100000 IJ SOLN
INTRAMUSCULAR | Status: DC | PRN
  Administered 2019-06-06: 20 mL

## 2019-06-06 MED ORDER — LIDOCAINE-EPINEPHRINE 1 %-1:100000 IJ SOLN
INTRAMUSCULAR | Status: AC
  Filled 2019-06-06: qty 1

## 2019-06-06 MED ORDER — ASPIRIN 81 MG PO TBEC: 81.0000 mg | DELAYED_RELEASE_TABLET | Freq: Every day | ORAL | Status: DC

## 2019-06-06 MED ORDER — ASPIRIN 81 MG PO TBEC: 81.0000 mg | DELAYED_RELEASE_TABLET | Freq: Every day | ORAL | Status: AC

## 2019-06-06 MED ORDER — ATORVASTATIN CALCIUM 40 MG PO TABS: 40.0000 mg | ORAL_TABLET | Freq: Every day | ORAL | 2 refills | Status: DC

## 2019-06-06 MED ORDER — ATORVASTATIN CALCIUM 40 MG PO TABS: 40.0000 mg | ORAL_TABLET | Freq: Every day | ORAL | 2 refills | Status: AC

## 2019-06-06 SURGICAL SUPPLY — 2 items
MONITOR REVEAL LINQ II (Prosthesis & Implant Heart) ×3 IMPLANT
PACK LOOP INSERTION (CUSTOM PROCEDURE TRAY) ×3 IMPLANT

## 2019-06-06 NOTE — Plan of Care (Signed)
Adequate for discharge.

## 2019-06-06 NOTE — Progress Notes (Signed)
During pre-procedure for TEE, pt stated he had a recent EGD and needed an esophageal dilation that was unable to be performed at that time.  Spoke with pt's wife who confirmed information and stated that patient had difficulty with food getting caught in his esophagus.  Dr. Gardiner Rhyme spoke with pt and decision made to cancel TEE for now.  Patient and spouse aware. Pt transferred back to inpatient bed.  Vista Lawman, RN

## 2019-06-06 NOTE — H&P (View-Only) (Signed)
° ° °ELECTROPHYSIOLOGY CONSULT NOTE  °Patient ID: Brian Randall °MRN: [id number], DOB/AGE: 60/01/1961  ° °Admit date: 06/04/2019 °Date of Consult: 06/06/2019 ° °Primary Physician: Patient, No Pcp Per °Primary Cardiologist: none °Reason for Consultation: Cryptogenic stroke ; recommendations regarding Implantable Loop Recorder, requested by Dr. Xu ° °History of Present Illness °Brian Randall was admitted on 06/04/2019 with having difficulty with words out, confusion, not quite understand questions. Initially reported left sided weakness that resolved.   °They first developed symptoms while at home. °He received tPA , was hypertensive °PMHx of HTN, hypothyroidism, hiatal hernia, BPH, dysphagia with esophageal polyp  ° ° °Imaging demonstrated no stroke, neurology noted presenting with global aphasia and right lower quadrantanopia  has asked for TEE and if negative loop to evaluate, for possible AF for TIA.  ° he has undergone workup for stroke including echocardiogram and carotid angio.  The patient has been monitored on telemetry which has demonstrated sinus rhythm with no arrhythmias.  Inpatient stroke work-up is to be completed with a TEE.  ° ° °Echocardiogram this admission demonstrated °IMPRESSIONS ° 1. Left ventricular ejection fraction, by visual estimation, is 60 to 65%. The left ventricle has normal function. Normal left ventricular size. There is no left ventricular hypertrophy. ° 2. Left ventricular diastolic Doppler parameters are consistent with impaired relaxation pattern of LV diastolic filling. ° 3. Global right ventricle has normal systolic function.The right ventricular size is normal. No increase in right ventricular wall thickness. ° 4. Left atrial size was normal. ° 5. Right atrial size was normal. ° 6. The mitral valve is normal in structure. No evidence of mitral valve regurgitation. No evidence of mitral stenosis. ° 7. The tricuspid valve is normal in structure. Tricuspid valve regurgitation  was not visualized by color flow Doppler. ° 8. The aortic valve is normal in structure. Aortic valve regurgitation was not visualized by color flow Doppler. Structurally normal aortic valve, with no evidence of sclerosis or stenosis. ° 9. The pulmonic valve was normal in structure. Pulmonic valve regurgitation is not visualized by color flow Doppler. °10. The inferior vena cava is normal in size with greater than 50% respiratory variability, suggesting right atrial pressure of 3 mmHg. °11. No embolic source identified. ° ° ° °Lab work is reviewed. ° ° ° °Prior to admission, the patient denies chest pain, shortness of breath, dizziness, palpitations, or syncope.  They are recovering from their stroke with plans to home at discharge. ° ° °  ° °Surgical History: History reviewed. No pertinent surgical history.  ° °Medications Prior to Admission  °Medication Sig Dispense Refill Last Dose  °• OVER THE COUNTER MEDICATION Take 1 tablet by mouth daily. Natural Thyroid   06/03/2019 at Unknown time  ° ° °Inpatient Medications:  °• aspirin EC  81 mg Oral Daily  °• atorvastatin  40 mg Oral q1800  °• Chlorhexidine Gluconate Cloth  6 each Topical Q0600  °• clopidogrel  75 mg Oral Daily  °• influenza vac split quadrivalent PF  0.5 mL Intramuscular Tomorrow-1000  °• pantoprazole  40 mg Oral Daily  ° ° °Allergies:  °Allergies  °Allergen Reactions  °• Codeine Nausea And Vomiting  ° ° °Social History  ° °Socioeconomic History  °• Marital status: Single  °  Spouse name: Not on file  °• Number of children: Not on file  °• Years of education: Not on file  °• Highest education level: Not on file  °Occupational History  °• Not on file  °Social Needs  °•   Financial resource strain: Not on file  °• Food insecurity  °  Worry: Not on file  °  Inability: Not on file  °• Transportation needs  °  Medical: Not on file  °  Non-medical: Not on file  °Tobacco Use  °• Smoking status: Never Smoker  °• Smokeless tobacco: Never Used  °Substance and Sexual  Activity  °• Alcohol use: Not on file  °• Drug use: Not on file  °• Sexual activity: Not on file  °Lifestyle  °• Physical activity  °  Days per week: Not on file  °  Minutes per session: Not on file  °• Stress: Not on file  °Relationships  °• Social connections  °  Talks on phone: Not on file  °  Gets together: Not on file  °  Attends religious service: Not on file  °  Active member of club or organization: Not on file  °  Attends meetings of clubs or organizations: Not on file  °  Relationship status: Not on file  °• Intimate partner violence  °  Fear of current or ex partner: Not on file  °  Emotionally abused: Not on file  °  Physically abused: Not on file  °  Forced sexual activity: Not on file  °Other Topics Concern  °• Not on file  °Social History Narrative  °• Not on file  °  ° °History reviewed. No pertinent family history.  ° ° °Review of Systems: °General: No chills, fever, night sweats or weight changes  °Cardiovascular:  No chest pain, dyspnea on exertion, edema, orthopnea, palpitations, paroxysmal nocturnal dyspnea °Dermatological: No rash, lesions or masses °Respiratory: No cough, dyspnea °Urologic: No hematuria, dysuria °Abdominal: No nausea, vomiting, diarrhea, bright red blood per rectum, melena, or hematemesis °Neurologic: No visual changes, weakness, changes in mental status °All other systems reviewed and are otherwise negative except as noted above. ° °Physical Exam: °Vitals:  ° 06/05/19 2059 06/06/19 0004 06/06/19 0415 06/06/19 0914  °BP:  (!) 117/96 102/61 134/84  °Pulse:  68 (!) 59 63  °Resp:  18 18 18  °Temp:  98 °F (36.7 °C) 98.4 °F (36.9 °C) 97.8 °F (36.6 °C)  °TempSrc:  Axillary Axillary Oral  °SpO2: 97% 93% 98% 100%  °Weight:      °Height:      ° ° °GEN- The patient is well appearing, alert and oriented x 3 today.   °Head- normocephalic, atraumatic °Eyes-  Sclera clear, conjunctiva pink °Ears- hearing intact °Oropharynx- clear °Neck- supple °Lungs- Clear to ausculation bilaterally,  normal work of breathing °Heart- Regular rate and rhythm, no murmurs, rubs or gallops  °GI- soft, NT, ND, + BS °Extremities- no clubbing, cyanosis, or edema °MS- no significant deformity or atrophy °Skin- no rash or lesion °Psych- euthymic mood, full affect ° ° °Labs: °  °Lab Results  °Component Value Date  ° WBC 8.4 06/06/2019  ° HGB 18.4 (H) 06/06/2019  ° HCT 58.0 (H) 06/06/2019  ° MCV 93.4 06/06/2019  ° PLT 155 06/06/2019  °  °Recent Labs  °Lab 06/04/19 °1153  06/06/19 °0421  °NA 139   < > 136  °K 4.5   < > 4.4  °CL 102   < > 101  °CO2 27   < > 27  °BUN 18   < > 16  °CREATININE 1.28*   < > 1.02  °CALCIUM 9.5   < > 8.7*  °PROT 7.2  --   --   °BILITOT 1.8*  --   --   °  ALKPHOS 62  --   --   °ALT 20  --   --   °AST 20  --   --   °GLUCOSE 118*   < > 88  ° < > = values in this interval not displayed.  ° °No results found for: CKTOTAL, CKMB, CKMBINDEX, TROPONINI °Lab Results  °Component Value Date  ° CHOL 190 06/06/2019  ° °Lab Results  °Component Value Date  ° HDL 35 (L) 06/06/2019  ° °Lab Results  °Component Value Date  ° LDLCALC 134 (H) 06/06/2019  ° °Lab Results  °Component Value Date  ° TRIG 104 06/06/2019  ° °Lab Results  °Component Value Date  ° CHOLHDL 5.4 06/06/2019  ° °No results found for: LDLDIRECT  °No results found for: DDIMER °  °Radiology/Studies:  ° °Ct Angio Head W Or Wo Contrast °Result Date: 06/04/2019 °CLINICAL DATA:  Code stroke.  Left-sided weakness. EXAM: CT ANGIOGRAPHY HEAD AND NECK CT PERFUSION BRAIN TECHNIQUE: Multidetector CT imaging of the head and neck was performed using the standard protocol during bolus administration of intravenous contrast. Multiplanar CT image reconstructions and MIPs were obtained to evaluate the vascular anatomy. Carotid stenosis measurements (when applicable) are obtained utilizing NASCET criteria, using the distal internal carotid diameter as the denominator. Multiphase CT imaging of the brain was performed following IV bolus contrast injection. Subsequent  parametric perfusion maps were calculated using RAPID software. CONTRAST:  100mL OMNIPAQUE IOHEXOL 350 MG/ML SOLN COMPARISON:  Head CT earlier same day FINDINGS: CTA NECK FINDINGS Aortic arch: Aortic atherosclerosis. No aneurysm or dissection. Branching pattern is normal. Right carotid system: Common carotid artery widely patent to the bifurcation. Soft and calcified plaque at the carotid bifurcation and ICA bulb but no stenosis. Cervical ICA is tortuous but widely patent to the skull base. Left carotid system: Common carotid artery widely patent to the bifurcation. Soft and calcified plaque at the bifurcation and ICA bulb. No stenosis. Cervical ICA is tortuous but widely patent to the skull base. Vertebral arteries: Each vertebral artery origin appears widely patent. Both vertebral arteries appear normal through the cervical region to foramen magnum. Skeleton: Ordinary spondylosis. Other neck: No mass or lymphadenopathy. Upper chest: Negative Review of the MIP images confirms the above findings CTA HEAD FINDINGS Anterior circulation: Both internal carotid arteries are patent through the skull base and siphon regions. There is atherosclerotic calcification in the carotid siphon regions but no stenosis greater than 30%. The anterior and middle cerebral vessels show flow without large or medium vessel occlusion. Distal branch vessels do show atherosclerotic irregularity. Posterior circulation: Both vertebral arteries are patent at the foramen magnum. There is atherosclerotic disease within the right V4 segment with irregular stenosis estimated at 50%. Beyond that the vessel is widely patent to the basilar. No basilar stenosis. Posterior circulation branch vessels remain patent. Venous sinuses: Patent and normal. Anatomic variants: None significant. Review of the MIP images confirms the above findings CT Brain Perfusion Findings: ASPECTS: 10 CBF (<30%) Volume: 0ML Perfusion (Tmax>6.0s) volume: 345mL. I think this  reflects poor cardiac output also related to diffuse small vessel disease. Infarction Location:No infarct suspected. IMPRESSION: No large or medium vessel occlusion. Atherosclerotic disease at both carotid bifurcations but without stenosis. Intracranial small vessel atherosclerotic irregularity. Irregular 50% stenosis of the right vertebral artery V4 segment. No other posterior circulation finding. Perfusion study does not suggest any acute insult. There is a large amount of brain with T-max greater than 6 seconds, but this is felt to reflect diminished cardiac output   and small-vessel disease. Electronically Signed   By: Mark  Shogry M.D.   On: 06/04/2019 13:01  ° ° °Mr Brain Wo Contrast °Result Date: 06/05/2019 °CLINICAL DATA:  Stroke follow-up.  Word-finding difficulty EXAM: MRI HEAD WITHOUT CONTRAST TECHNIQUE: Multiplanar, multiecho pulse sequences of the brain and surrounding structures were obtained without intravenous contrast. COMPARISON:  Stroke workup including CTA from yesterday FINDINGS: Brain: No acute infarction, hemorrhage, hydrocephalus, extra-axial collection or mass lesion. Vascular: Normal flow voids Skull and upper cervical spine: Negative for marrow lesion. Patchy scalp scarring. Sinuses/Orbits: Negative Other: Mild motion degradation. IMPRESSION: Normal brain MRI. Electronically Signed   By: Jonathon  Watts M.D.   On: 06/05/2019 12:00  ° ° °Dg Chest Port 1 View °Result Date: 06/04/2019 °CLINICAL DATA:  Shortness of breath. EXAM: PORTABLE CHEST 1 VIEW COMPARISON:  None. FINDINGS: There is minimal left basilar atelectasis. Lungs otherwise clear. Heart size is normal. Atherosclerosis noted. No pneumothorax or pleural fluid. No acute or focal bony abnormality. IMPRESSION: No acute disease. Atherosclerosis. Electronically Signed   By: Thomas  Dalessio M.D.   On: 06/04/2019 15:00  ° ° °Ct Head Code Stroke Wo Contrast °Result Date: 06/04/2019 °CLINICAL DATA:  Code stroke. Aphasia. Left-sided weakness.  Last seen normal 1.5 hours ago. EXAM: CT HEAD WITHOUT CONTRAST TECHNIQUE: Contiguous axial images were obtained from the base of the skull through the vertex without intravenous contrast. COMPARISON:  None. FINDINGS: Brain: No acute cortical infarct is present. Basal ganglia are intact. Insular ribbon is normal. The ventricles are of normal size. No significant extraaxial fluid collection is present. The brainstem and cerebellum are within normal limits. Vascular: Atherosclerotic calcifications are present within the cavernous internal carotid arteries and at the dural margin of both vertebral arteries. Vessels are mildly dense throughout. There is no significant asymmetry. Skull: Calvarium is intact. No focal lytic or blastic lesions are present. Sinuses/Orbits: The paranasal sinuses and mastoid air cells are clear. The globes and orbits are within normal limits. ASPECTS (Alberta Stroke Program Early CT Score) - Ganglionic level infarction (caudate, lentiform nuclei, internal capsule, insula, M1-M3 cortex): 7/7 - Supraganglionic infarction (M4-M6 cortex): 3/3 Total score (0-10 with 10 being normal): 10/10 IMPRESSION: 1. Normal CT appearance of the brain.  No acute infarct evident. 2. Atherosclerotic changes noted within the cavernous internal carotid arteries and at the dural margin of the vertebral arteries bilaterally without asymmetric hyperdense vessel 3. ASPECTS is 10/10 The above was relayed via text pager to Dr. MARCY PFEIFFER on 06/04/2019 at 12:37 . Electronically Signed   By: Christopher  Mattern M.D.   On: 06/04/2019 12:38  ° ° °Vas Us Lower Extremity Venous (dvt) °Result Date: 06/05/2019 ° Lower Venous Study Indications: Stroke.  Comparison Study: no prior Performing Technologist: Jill Parker RDMS, RVT  Examination Guidelines: A complete evaluation includes B-mode imaging, spectral Doppler, color Doppler, and power Doppler as needed of all accessible portions of each vessel. Bilateral testing is  considered an integral part of a complete examination. Limited examinations for reoccurring indications may be performed as noted.  Summary: Right: There is no evidence of deep vein thrombosis in the lower extremity. No cystic structure found in the popliteal fossa. Left: There is no evidence of deep vein thrombosis in the lower extremity. No cystic structure found in the popliteal fossa.  *See table(s) above for measurements and observations. Electronically signed by Christopher Dickson MD on 06/05/2019 at 4:14:35 PM.    Final   ° ° °12-lead ECG SR °All prior EKG's in EPIC reviewed with no   documented atrial fibrillation ° °Telemetry - nsr ° °Assessment and Plan: ° °1. Cryptogenic stroke °The patient presents with cryptogenic stroke.  The patient has a TEE planned for this AM.  I spoke at length with the patient about monitoring for afib with either a 30 day event monitor or an implantable loop recorder.  Risks, benefits, and alteratives to implantable loop recorder were discussed with the patient today.   At this time, the patient is very clear in their decision to proceed with implantable loop recorder.  ° °Wound care was reviewed with the patient (keep incision clean and dry for 3 days).  Wound check scheduled for  ° °Please call with questions. ° ° °Renee Lynn Ursuy, PA-C °06/06/2019 ° °EP Attending ° °Patient seen and examined. Agree with the findings as noted above. The patient has recovered from a cryptogenic stroke and we will undergo TEE and if no etiology for the stroke we will plan for ILR insertion. ° °Darion Juhasz,M.D. °

## 2019-06-06 NOTE — Evaluation (Signed)
Occupational Therapy Evaluation Patient Details Name: Brian Randall MRN: [id number] DOB: 04/01/1961 Today's Date: 06/06/2019    History of Present Illness Brian Randall is 58 yo male with PMH of HTN,hypothyroidism,HH,BPH,recent esophogeal stretch admitted 9/21 with stroke, difficulty speaking, left side weakness. Patient given tpa.   Clinical Impression   Pt PTA: living with children and independent, working. Pt Pt currently with functional limitiations due to the deficits that have resolved. Pt with extensive vision testing, but pt able to scan, converge and see through peripheral vision, WFLs. Pt able to read clock and nametag. No weakness noted. Pt reports stiffness from sitting in chair, but this is normal for him. Pt with no focal deficits noted upon exam. [name]. OT signing off.     Follow Up Recommendations  No OT follow up    Equipment Recommendations  None recommended by OT    Recommendations for Other Services       Precautions / Restrictions Precautions Precautions: None Restrictions Weight Bearing Restrictions: No      Mobility Bed Mobility Overal bed mobility: Independent                Transfers Overall transfer level: Needs assistance Equipment used: None Transfers: Sit to/from Stand Sit to Stand: Independent              Balance Overall balance assessment: Modified Independent                                         ADL either performed or assessed with clinical judgement   ADL Overall ADL's : At baseline;Modified independent                                       General ADL Comments: No physical assist required.     Vision Baseline Vision/History: No visual deficits Patient Visual Report: Other (comment)(no specific deficits- not blurry either, just "not as clear") Vision Assessment?: No apparent visual deficits;Yes Eye Alignment:  Within Functional Limits Ocular Range of Motion: Within Functional Limits Alignment/Gaze Preference: Within Defined Limits Tracking/Visual Pursuits: Able to track stimulus in all quads without difficulty Saccades: Within functional limits Additional Comments: no discrepencies or difficulty noted     Perception     Praxis      Pertinent Vitals/Pain Pain Assessment: 0-10 Pain Score: 3  Pain Location: low back Pain Descriptors / Indicators: Discomfort Pain Intervention(s): Patient requesting pain meds-RN notified     Hand Dominance Right   Extremity/Trunk Assessment Upper Extremity Assessment Upper Extremity Assessment: Overall WFL for tasks assessed   Lower Extremity Assessment Lower Extremity Assessment: Overall WFL for tasks assessed   Cervical / Trunk Assessment Cervical / Trunk Assessment: Normal   Communication Communication Communication: No difficulties   Cognition Arousal/Alertness: Awake/alert Behavior During Therapy: WFL for tasks assessed/performed Overall Cognitive Status: Within Functional Limits for tasks assessed                                     General Comments       Exercises     Shoulder Instructions      Home Living Family/patient expects to be discharged to:: Private residence Living  Arrangements: Children Available Help at Discharge: Family Type of Home: House Home Access: Stairs to enter [employer] of Steps: 1   Home Layout: Two level;Able to live on main level with bedroom/bathroom     Bathroom Shower/Tub: Tub/shower unit;Walk-in shower   Bathroom Toilet: Standard     Home Equipment: None          Prior Functioning/Environment Level of Independence: Independent        Comments: works with heavy equipment, grading.        OT Problem List: Decreased strength;Decreased activity tolerance;Impaired balance (sitting and/or standing);Decreased coordination;Decreased safety awareness;Impaired UE  functional use;Pain      OT Treatment/Interventions:      OT Goals(Current goals can be found in the care plan section) Acute Rehab OT Goals Patient Stated Goal: to go home OT Goal Formulation: With patient Time For Goal Achievement: 06/20/19 Potential to Achieve Goals: Good  OT Frequency:     Barriers to D/C:            Co-evaluation              AM-PAC OT "6 Clicks" Daily Activity     Outcome Measure Help from another person eating meals?: None Help from another person taking care of personal grooming?: None Help from another person toileting, which includes using toliet, bedpan, or urinal?: None Help from another person bathing (including washing, rinsing, drying)?: None Help from another person to put on and taking off regular upper body clothing?: None Help from another person to put on and taking off regular lower body clothing?: None 6 Click Score: 24   End of Session Nurse Communication: Mobility status  Activity Tolerance: Patient tolerated treatment well Patient left: in bed;with call bell/phone within reach  OT Visit Diagnosis: Unsteadiness on feet (R26.81);Muscle weakness (generalized) (M62.81)                Time: 7322-0254 OT Time Calculation (min): 15 min Charges:  OT General Charges $OT Visit: 1 Visit OT Evaluation $OT Eval Moderate Complexity: 1 Mod  Cristi Loron) Glendell Docker OTR/L Acute Rehabilitation Services Pager: [phone] Office: [phone]   Lonzo Cloud 06/06/2019, 10:50 AM

## 2019-06-06 NOTE — Discharge Summary (Addendum)
Stroke Discharge Summary  Patient ID: Brian Randall   MRN: [id number]      DOB: 09-24-1960  Date of Admission: 06/04/2019 Date of Discharge: 06/06/2019  Attending Physician:  Marvel Plan, MD, Stroke MD Consultant(s):   Lewayne Bunting, MD (electrophysiology)  Patient's PCP:  Patient, No Pcp Per  DISCHARGE DIAGNOSIS:  Principal Problem:   L brain cortical TIA s/p tPA Active Problems:   Migraine   Hypertension   Polycythemia   BPH (benign prostatic hyperplasia)   OSA on CPAP   Hypothyroid   Esophageal stricture  History reviewed. No pertinent past medical history. History reviewed. No pertinent surgical history.  Allergies as of 06/06/2019      Reactions   Codeine Nausea And Vomiting      Medication List    TAKE these medications   aspirin 81 MG EC tablet Take 1 tablet (81 mg total) by mouth daily. Start taking on: June 07, 2019   atorvastatin 40 MG tablet Commonly known as: LIPITOR Take 1 tablet (40 mg total) by mouth daily at 6 PM.   clopidogrel 75 MG tablet Commonly known as: PLAVIX Take 1 tablet (75 mg total) by mouth daily. Take aspirin with plavix x 21 days then stop plavix and continue aspirin Start taking on: June 07, 2019   OVER THE COUNTER MEDICATION Take 1 tablet by mouth daily. Natural Thyroid   senna-docusate 8.6-50 MG tablet Commonly known as: Senokot-S Take 1 tablet by mouth at bedtime as needed for mild constipation.       LABORATORY STUDIES CBC    Component Value Date/Time   WBC 8.4 06/06/2019 0421   RBC 6.21 (H) 06/06/2019 0421   HGB 18.4 (H) 06/06/2019 0421   HCT 58.0 (H) 06/06/2019 0421   PLT 155 06/06/2019 0421   MCV 93.4 06/06/2019 0421   MCH 29.6 06/06/2019 0421   MCHC 31.7 06/06/2019 0421   RDW 17.2 (H) 06/06/2019 0421   LYMPHSABS 4.1 (H) 06/04/2019 1153   MONOABS 0.8 06/04/2019 1153   EOSABS 0.1 06/04/2019 1153   BASOSABS 0.1 06/04/2019 1153   CMP    Component Value Date/Time   NA 136 06/06/2019 0421    K 4.4 06/06/2019 0421   CL 101 06/06/2019 0421   CO2 27 06/06/2019 0421   GLUCOSE 88 06/06/2019 0421   BUN 16 06/06/2019 0421   CREATININE 1.02 06/06/2019 0421   CALCIUM 8.7 (L) 06/06/2019 0421   PROT 7.2 06/04/2019 1153   ALBUMIN 3.8 06/04/2019 1153   AST 20 06/04/2019 1153   ALT 20 06/04/2019 1153   ALKPHOS 62 06/04/2019 1153   BILITOT 1.8 (H) 06/04/2019 1153   GFRNONAA >60 06/06/2019 0421   GFRAA >60 06/06/2019 0421   COAGS Lab Results  Component Value Date   INR 1.1 06/04/2019   Lipid Panel    Component Value Date/Time   CHOL 190 06/06/2019 0421   TRIG 104 06/06/2019 0421   HDL 35 (L) 06/06/2019 0421   CHOLHDL 5.4 06/06/2019 0421   VLDL 21 06/06/2019 0421   LDLCALC 134 (H) 06/06/2019 0421   HgbA1C  Lab Results  Component Value Date   HGBA1C 5.3 06/05/2019    SIGNIFICANT DIAGNOSTIC STUDIES Ct Angio Head W Or Wo Contrast  Result Date: 06/04/2019 CLINICAL DATA:  Code stroke.  Left-sided weakness. EXAM: CT ANGIOGRAPHY HEAD AND NECK CT PERFUSION BRAIN TECHNIQUE: Multidetector CT imaging of the head and neck was performed using the standard protocol during bolus administration of intravenous contrast.  Multiplanar CT image reconstructions and MIPs were obtained to evaluate the vascular anatomy. Carotid stenosis measurements (when applicable) are obtained utilizing NASCET criteria, using the distal internal carotid diameter as the denominator. Multiphase CT imaging of the brain was performed following IV bolus contrast injection. Subsequent parametric perfusion maps were calculated using RAPID software. CONTRAST:  OMNIPAQUE IOHEXOL 350 MG/ML SOLN COMPARISON:  Head CT earlier same day FINDINGS: CTA NECK FINDINGS Aortic arch: Aortic atherosclerosis. No aneurysm or dissection. Branching pattern is normal. Right carotid system: Common carotid artery widely patent to the bifurcation. Soft and calcified plaque at the carotid bifurcation and ICA bulb but no stenosis. Cervical ICA  is tortuous but widely patent to the skull base. Left carotid system: Common carotid artery widely patent to the bifurcation. Soft and calcified plaque at the bifurcation and ICA bulb. No stenosis. Cervical ICA is tortuous but widely patent to the skull base. Vertebral arteries: Each vertebral artery origin appears widely patent. Both vertebral arteries appear normal through the cervical region to foramen magnum. Skeleton: Ordinary spondylosis. Other neck: No mass or lymphadenopathy. Upper chest: Negative Review of the MIP images confirms the above findings CTA HEAD FINDINGS Anterior circulation: Both internal carotid arteries are patent through the skull base and siphon regions. There is atherosclerotic calcification in the carotid siphon regions but no stenosis greater than 30%. The anterior and middle cerebral vessels show flow without large or medium vessel occlusion. Distal branch vessels do show atherosclerotic irregularity. Posterior circulation: Both vertebral arteries are patent at the foramen magnum. There is atherosclerotic disease within the right V4 segment with irregular stenosis estimated at 50%. Beyond that the vessel is widely patent to the basilar. No basilar stenosis. Posterior circulation branch vessels remain patent. Venous sinuses: Patent and normal. Anatomic variants: None significant. Review of the MIP images confirms the above findings CT Brain Perfusion Findings: ASPECTS: 10 CBF (<30%) Volume: Perfusion (Tmax>6.0s) volume: . I think this reflects poor cardiac output also related to diffuse small vessel disease. Infarction Location:No infarct suspected. IMPRESSION: No large or medium vessel occlusion. Atherosclerotic disease at both carotid bifurcations but without stenosis. Intracranial small vessel atherosclerotic irregularity. Irregular 50% stenosis of the right vertebral artery V4 segment. No other posterior circulation finding. Perfusion study does not suggest any acute insult.  There is a large amount of brain with T-max greater than 6 seconds, but this is felt to reflect diminished cardiac output and small-vessel disease. Electronically Signed   By: Paulina Fusi M.D.   On: 06/04/2019 13:01   Ct Angio Neck W Or Wo Contrast  Result Date: 06/04/2019 CLINICAL DATA:  Code stroke.  Left-sided weakness. EXAM: CT ANGIOGRAPHY HEAD AND NECK CT PERFUSION BRAIN TECHNIQUE: Multidetector CT imaging of the head and neck was performed using the standard protocol during bolus administration of intravenous contrast. Multiplanar CT image reconstructions and MIPs were obtained to evaluate the vascular anatomy. Carotid stenosis measurements (when applicable) are obtained utilizing NASCET criteria, using the distal internal carotid diameter as the denominator. Multiphase CT imaging of the brain was performed following IV bolus contrast injection. Subsequent parametric perfusion maps were calculated using RAPID software. CONTRAST:  OMNIPAQUE IOHEXOL 350 MG/ML SOLN COMPARISON:  Head CT earlier same day FINDINGS: CTA NECK FINDINGS Aortic arch: Aortic atherosclerosis. No aneurysm or dissection. Branching pattern is normal. Right carotid system: Common carotid artery widely patent to the bifurcation. Soft and calcified plaque at the carotid bifurcation and ICA bulb but no stenosis. Cervical ICA is tortuous but widely patent to  the skull base. Left carotid system: Common carotid artery widely patent to the bifurcation. Soft and calcified plaque at the bifurcation and ICA bulb. No stenosis. Cervical ICA is tortuous but widely patent to the skull base. Vertebral arteries: Each vertebral artery origin appears widely patent. Both vertebral arteries appear normal through the cervical region to foramen magnum. Skeleton: Ordinary spondylosis. Other neck: No mass or lymphadenopathy. Upper chest: Negative Review of the MIP images confirms the above findings CTA HEAD FINDINGS Anterior circulation: Both internal  carotid arteries are patent through the skull base and siphon regions. There is atherosclerotic calcification in the carotid siphon regions but no stenosis greater than 30%. The anterior and middle cerebral vessels show flow without large or medium vessel occlusion. Distal branch vessels do show atherosclerotic irregularity. Posterior circulation: Both vertebral arteries are patent at the foramen magnum. There is atherosclerotic disease within the right V4 segment with irregular stenosis estimated at 50%. Beyond that the vessel is widely patent to the basilar. No basilar stenosis. Posterior circulation branch vessels remain patent. Venous sinuses: Patent and normal. Anatomic variants: None significant. Review of the MIP images confirms the above findings CT Brain Perfusion Findings: ASPECTS: 10 CBF (<30%) Volume: 0ML Perfusion (Tmax>6.0s) volume: 345mL. I think this reflects poor cardiac output also related to diffuse small vessel disease. Infarction Location:No infarct suspected. IMPRESSION: No large or medium vessel occlusion. Atherosclerotic disease at both carotid bifurcations but without stenosis. Intracranial small vessel atherosclerotic irregularity. Irregular 50% stenosis of the right vertebral artery V4 segment. No other posterior circulation finding. Perfusion study does not suggest any acute insult. There is a large amount of brain with T-max greater than 6 seconds, but this is felt to reflect diminished cardiac output and small-vessel disease. Electronically Signed   By: Paulina FusiMark  Shogry M.D.   On: 06/04/2019 13:01   Mr Brain Wo Contrast  Result Date: 06/05/2019 CLINICAL DATA:  Stroke follow-up.  Word-finding difficulty EXAM: MRI HEAD WITHOUT CONTRAST TECHNIQUE: Multiplanar, multiecho pulse sequences of the brain and surrounding structures were obtained without intravenous contrast. COMPARISON:  Stroke workup including CTA from yesterday FINDINGS: Brain: No acute infarction, hemorrhage, hydrocephalus,  extra-axial collection or mass lesion. Vascular: Normal flow voids Skull and upper cervical spine: Negative for marrow lesion. Patchy scalp scarring. Sinuses/Orbits: Negative Other: Mild motion degradation. IMPRESSION: Normal brain MRI. Electronically Signed   By: Marnee SpringJonathon  Watts M.D.   On: 06/05/2019 12:00   Ct Cerebral Perfusion W Contrast  Result Date: 06/04/2019 CLINICAL DATA:  Code stroke.  Left-sided weakness. EXAM: CT ANGIOGRAPHY HEAD AND NECK CT PERFUSION BRAIN TECHNIQUE: Multidetector CT imaging of the head and neck was performed using the standard protocol during bolus administration of intravenous contrast. Multiplanar CT image reconstructions and MIPs were obtained to evaluate the vascular anatomy. Carotid stenosis measurements (when applicable) are obtained utilizing NASCET criteria, using the distal internal carotid diameter as the denominator. Multiphase CT imaging of the brain was performed following IV bolus contrast injection. Subsequent parametric perfusion maps were calculated using RAPID software. CONTRAST:  100mL OMNIPAQUE IOHEXOL 350 MG/ML SOLN COMPARISON:  Head CT earlier same day FINDINGS: CTA NECK FINDINGS Aortic arch: Aortic atherosclerosis. No aneurysm or dissection. Branching pattern is normal. Right carotid system: Common carotid artery widely patent to the bifurcation. Soft and calcified plaque at the carotid bifurcation and ICA bulb but no stenosis. Cervical ICA is tortuous but widely patent to the skull base. Left carotid system: Common carotid artery widely patent to the bifurcation. Soft and calcified plaque at the bifurcation  and ICA bulb. No stenosis. Cervical ICA is tortuous but widely patent to the skull base. Vertebral arteries: Each vertebral artery origin appears widely patent. Both vertebral arteries appear normal through the cervical region to foramen magnum. Skeleton: Ordinary spondylosis. Other neck: No mass or lymphadenopathy. Upper chest: Negative Review of the MIP  images confirms the above findings CTA HEAD FINDINGS Anterior circulation: Both internal carotid arteries are patent through the skull base and siphon regions. There is atherosclerotic calcification in the carotid siphon regions but no stenosis greater than 30%. The anterior and middle cerebral vessels show flow without large or medium vessel occlusion. Distal branch vessels do show atherosclerotic irregularity. Posterior circulation: Both vertebral arteries are patent at the foramen magnum. There is atherosclerotic disease within the right V4 segment with irregular stenosis estimated at 50%. Beyond that the vessel is widely patent to the basilar. No basilar stenosis. Posterior circulation branch vessels remain patent. Venous sinuses: Patent and normal. Anatomic variants: None significant. Review of the MIP images confirms the above findings CT Brain Perfusion Findings: ASPECTS: 10 CBF (<30%) Volume: Perfusion (Tmax>6.0s) volume: . I think this reflects poor cardiac output also related to diffuse small vessel disease. Infarction Location:No infarct suspected. IMPRESSION: No large or medium vessel occlusion. Atherosclerotic disease at both carotid bifurcations but without stenosis. Intracranial small vessel atherosclerotic irregularity. Irregular 50% stenosis of the right vertebral artery V4 segment. No other posterior circulation finding. Perfusion study does not suggest any acute insult. There is a large amount of brain with T-max greater than 6 seconds, but this is felt to reflect diminished cardiac output and small-vessel disease. Electronically Signed   By: Paulina Fusi M.D.   On: 06/04/2019 13:01   Dg Chest Port 1 View  Result Date: 06/04/2019 CLINICAL DATA:  Shortness of breath. EXAM: PORTABLE CHEST 1 VIEW COMPARISON:  None. FINDINGS: There is minimal left basilar atelectasis. Lungs otherwise clear. Heart size is normal. Atherosclerosis noted. No pneumothorax or pleural fluid. No acute or focal  bony abnormality. IMPRESSION: No acute disease. Atherosclerosis. Electronically Signed   By: Drusilla Kanner M.D.   On: 06/04/2019 15:00   Ct Head Code Stroke Wo Contrast  Result Date: 06/04/2019 CLINICAL DATA:  Code stroke. Aphasia. Left-sided weakness. Last seen normal 1.5 hours ago. EXAM: CT HEAD WITHOUT CONTRAST TECHNIQUE: Contiguous axial images were obtained from the base of the skull through the vertex without intravenous contrast. COMPARISON:  None. FINDINGS: Brain: No acute cortical infarct is present. Basal ganglia are intact. Insular ribbon is normal. The ventricles are of normal size. No significant extraaxial fluid collection is present. The brainstem and cerebellum are within normal limits. Vascular: Atherosclerotic calcifications are present within the cavernous internal carotid arteries and at the dural margin of both vertebral arteries. Vessels are mildly dense throughout. There is no significant asymmetry. Skull: Calvarium is intact. No focal lytic or blastic lesions are present. Sinuses/Orbits: The paranasal sinuses and mastoid air cells are clear. The globes and orbits are within normal limits. ASPECTS [facility] Stroke Program Early CT Score) - Ganglionic level infarction (caudate, lentiform nuclei, internal capsule, insula, M1-M3 cortex): 7/7 - Supraganglionic infarction (M4-M6 cortex): 3/3 Total score (0-10 with 10 being normal): 10/10 IMPRESSION: 1. Normal CT appearance of the brain.  No acute infarct evident. 2. Atherosclerotic changes noted within the cavernous internal carotid arteries and at the dural margin of the vertebral arteries bilaterally without asymmetric hyperdense vessel 3. ASPECTS is 10/10 The above was relayed via text pager to Dr. Lebron Conners PFEIFFER on 06/04/2019  at 12:37 . Electronically Signed   By: Marin Roberts M.D.   On: 06/04/2019 12:38   Vas [place] Lower Extremity Venous (dvt)  Result Date: 06/05/2019  Lower Venous Study Indications: Stroke.  Comparison Study: no  prior Performing Technologist: Jeb Levering RDMS, RVT  Examination Guidelines: A complete evaluation includes B-mode imaging, spectral Doppler, color Doppler, and power Doppler as needed of all accessible portions of each vessel. Bilateral testing is considered an integral part of a complete examination. Limited examinations for reoccurring indications may be performed as noted.  +---------+---------------+---------+-----------+----------+--------------+ RIGHT    CompressibilityPhasicitySpontaneityPropertiesThrombus Aging +---------+---------------+---------+-----------+----------+--------------+ CFV      Full           Yes      Yes                                 +---------+---------------+---------+-----------+----------+--------------+ SFJ      Full                                                        +---------+---------------+---------+-----------+----------+--------------+ FV Prox  Full                                                        +---------+---------------+---------+-----------+----------+--------------+ FV Mid   Full                                                        +---------+---------------+---------+-----------+----------+--------------+ FV DistalFull                                                        +---------+---------------+---------+-----------+----------+--------------+ PFV      Full                                                        +---------+---------------+---------+-----------+----------+--------------+ POP      Full           Yes      Yes                                 +---------+---------------+---------+-----------+----------+--------------+ PTV      Full                                                        +---------+---------------+---------+-----------+----------+--------------+ PERO     Full                                                         +---------+---------------+---------+-----------+----------+--------------+   +---------+---------------+---------+-----------+----------+--------------+  LEFT     CompressibilityPhasicitySpontaneityPropertiesThrombus Aging +---------+---------------+---------+-----------+----------+--------------+ CFV      Full           Yes      Yes                                 +---------+---------------+---------+-----------+----------+--------------+ SFJ      Full                                                        +---------+---------------+---------+-----------+----------+--------------+ FV Prox  Full                                                        +---------+---------------+---------+-----------+----------+--------------+ FV Mid   Full                                                        +---------+---------------+---------+-----------+----------+--------------+ FV DistalFull                                                        +---------+---------------+---------+-----------+----------+--------------+ PFV      Full                                                        +---------+---------------+---------+-----------+----------+--------------+ POP      Full           Yes      Yes                                 +---------+---------------+---------+-----------+----------+--------------+ PTV      Full                                                        +---------+---------------+---------+-----------+----------+--------------+ PERO     Full                                                        +---------+---------------+---------+-----------+----------+--------------+     Summary: Right: There is no evidence of deep vein thrombosis in the lower extremity. No cystic structure found in the popliteal fossa. Left: There is no evidence of deep vein thrombosis in the lower extremity. No cystic structure found in the popliteal fossa.  *  See  table(s) above for measurements and observations. Electronically signed by Deitra Mayo MD on 06/05/2019 at 4:14:35 PM.    Final     2D Echocardiogram  1. Left ventricular ejection fraction, by visual estimation, is 60 to 65%. The left ventricle has normal function. Normal left ventricular size. There is no left ventricular hypertrophy.  2. Left ventricular diastolic Doppler parameters are consistent with impaired relaxation pattern of LV diastolic filling.  3. Global right ventricle has normal systolic function.The right ventricular size is normal. No increase in right ventricular wall thickness.  4. Left atrial size was normal.  5. Right atrial size was normal.  6. The mitral valve is normal in structure. No evidence of mitral valve regurgitation. No evidence of mitral stenosis.  7. The tricuspid valve is normal in structure. Tricuspid valve regurgitation was not visualized by color flow Doppler.  8. The aortic valve is normal in structure. Aortic valve regurgitation was not visualized by color flow Doppler. Structurally normal aortic valve, with no evidence of sclerosis or stenosis.  9. The pulmonic valve was normal in structure. Pulmonic valve regurgitation is not visualized by color flow Doppler. 10. The inferior vena cava is normal in size with greater than 50% respiratory variability, suggesting right atrial pressure of 3 mmHg. 11. No embolic source identified.     HISTORY OF PRESENT ILLNESS Brian Randall is a 58 y.o. Caucasian male with PMH of HTN, Hypothyroidism, hiatal hernia, BPH, dysphagia with esophageal polyp admitted for stroke.   Pt was at his normal health this am. Last seen well 10:45am on 06/04/2019. Around 11am son saw him having difficulty with words out, confusion, not quite understand questions. Initially reported left sided weakness. However, in ER pt has no motor deficit but found to have global aphasia and right lower quadrantanopia. CT no acute abnormality and pt has  no contraindication for tPA. After discussion with daughter and pt, tPA was given. BP 160/102 at time of tPA infusion.   Pt had recent esophageal stretch procedure, found to have polyp and biopsy benign. He is on flomax and synthroid at home.   HOSPITAL COURSE Brian Randall is a 58 y.o. male with history of HTN, Hypothyroidism, hiatal hernia, BPH, dysphagia with esophageal polyp presenting with global aphasia and right lower quadrantanopia. Received tPA 06/04/2019 at 1210.    L brain cortical TIA s/p tPA. However, migraine equivalent can not be ruled out   Code Stroke CT head No acute abnormality. Atherosclerotic changed B ICAs and VAs.  ASPECTS 10.     CTA head & neck no ELVO. B ICA bifurcation atherosclerosis. Intracranial small vessel disease. R VA 50% irreg stenosis.   CT perfusion no acute infarct  MRI normal  LE Doppler no DVT  2D Echo EF 60-65%. No source of embolus   Unable to do TEE given hx esophageal stricture w/ recent dilatation   implantable loop recorder placed 9/23  HIV negative  LDL 134  HgbA1c 5.3  SCDs for VTE prophylaxis  No antithrombotic prior to admission, now on aspirin 81 and Plavix 75 DAPT for 3 weeks and then aspirin alone.  Therapy recommendations: None  Disposition:  return home  Migraine  Patient had migraine headache when he was young, no complicated migraine  Headache recurrent from 2 weeks ago  Current episode without headache  Migraine equivalent cannot be completely ruled out at this time  Hypertension  Home meds:  None listed  BP stable  Long-term BP goal normotensive  Hyperlipidemia  LDL 134, goal < 70  Put on lipitor 40  Continue on discharge  Polycythemia  Hemoglobin 18.4-17.6-18.4  Likely related to OSA and recent testosterone use  Continue monitoring  Other Stroke Risk Factors  OSA - on CPAP at home  Other Active Problems  BPH on flomax at home  Hypothyroid on synthroid at  home  Recent esophageal dilatation w/ benign polyp   DISCHARGE EXAM Blood pressure 134/84, pulse 63, temperature 97.8 F (36.6 C), temperature source Oral, resp. rate 18, height 6' (1.829 m), weight (!) 143.7 kg, SpO2 100 %. General- obese, well developed, in no apparent distress.   Ophthalmologic - fundi not visualized due to noncooperation.   Cardiovascular -regular rate and rhythm  Mental Status - Awake alert.However, partial global aphasia, not following simple commands. Able to tell me his name but not able to answer other questions, able to cooperative on visual field testing but not able to follow commands on the hand or foot.  Cranial Nerves II - XII- II -visual field full III, IV, VI - Extraocular movements intact V - Facial sensation intact bilaterally. VII -facial symmetric VIII - Hearing & vestibular intact bilaterally. X - Palate elevates symmetrically. XI - Chin turning & shoulder shrug intact bilaterally. XII - Tongue protrusion intact.  Motor Strength -The patient's strength was normal in all extremities and pronator drift was absent.   Motor Tone & Bulk- Muscle tone was assessed at the neck and appendages and was normal. Bulk was normal and fasciculations were absent.   Reflexes -The patient's reflexes were normal in all extremities andhehad no pathological reflexes.  Sensory -Light touch, temperature/pinprick were not cooperative.   Coordination -not cooperative on exam. Tremor was absent.  Gait and Station-deferred  Discharge Diet   Regular thin liquids  DISCHARGE PLAN  Disposition:  Return home  aspirin 81 mg daily and clopidogrel 75 mg daily for secondary stroke prevention for 3 weeks then ASPIRIN alone.  Ongoing stroke risk factor control by Primary Care Physician at time of discharge  Follow-up PCP in 2 weeks.  Follow-up in [facility] in 4 weeks, office to schedule an  appointment.  Follow-up [facility] for implantable loop recorder wound check in 10-14 days, office to schedule an appointment.  Loop recorder to be monitored by [facility] for atrial fibrillation as source of stroke. If found, they will notify patient.  35 minutes were spent preparing discharge.  Marvel Plan, MD PhD Stroke Neurology 06/06/2019 3:54 PM

## 2019-06-06 NOTE — TOC Transition Note (Signed)
Transition of Care [facility]) - CM/SW Discharge Note   Patient Details  Name: Brian Randall MRN: [id number] Date of Birth: February 25, 1961  Transition of Care [facility] CM/SW Contact:  Pollie Friar, RN Phone Number: 06/06/2019, 3:24 PM   Clinical Narrative:    Pt discharging home with self care. No f/u per PT/OT and no DME needs.  PCP: Dr Peter Minium in [place] Pt has supervision at home and transportation to home.   Final next level of care: Home/Self Care Barriers to Discharge: No Barriers Identified   Patient Goals and CMS Choice        Discharge Placement                       Discharge Plan and Services                                     Social Determinants of Health (SDOH) Interventions     Readmission Risk Interventions No flowsheet data found.

## 2019-06-06 NOTE — Progress Notes (Signed)
Patient discharged home with family, no HH

## 2019-06-06 NOTE — Discharge Instructions (Signed)
Implant site/wound care instructions °Keep incision clean and dry for 3 days. °You can remove outer dressing tomorrow. °Leave steri-strips (little pieces of tape) on until seen in the office for wound check appointment. °Call the office ([phone]) for redness, drainage, swelling, or fever. ° °

## 2019-06-06 NOTE — Progress Notes (Signed)
SLP Cancellation Note  Patient Details Name: Hershell Brandl MRN: [id number] DOB: [birth year]   Cancelled treatment:       Reason Eval/Treat Not Completed: SLP screened, no needs identified, will sign off   Sabella Traore, Katherene Ponto 06/06/2019, 3:55 PM

## 2019-06-06 NOTE — Progress Notes (Signed)
Patient refused flu shot at this time

## 2019-06-06 NOTE — Consult Note (Signed)
ELECTROPHYSIOLOGY CONSULT NOTE  Patient ID: Brian Randall MRN: [id number], DOB/AGE: 60-16-1962   Admit date: 06/04/2019 Date of Consult: 06/06/2019  Primary Physician: Patient, No Pcp Per Primary Cardiologist: none Reason for Consultation: Cryptogenic stroke ; recommendations regarding Implantable Loop Recorder, requested by Dr. Roda Shutters  History of Present Illness Brian Randall was admitted on 06/04/2019 with having difficulty with words out, confusion, not quite understand questions. Initially reported left sided weakness that resolved.   They first developed symptoms while at home. He received tPA , was hypertensive PMHx of HTN, hypothyroidism, hiatal hernia, BPH, dysphagia with esophageal polyp    Imaging demonstrated no stroke, neurology noted presenting with global aphasia and right lower quadrantanopia  has asked for TEE and if negative loop to evaluate, for possible AF for TIA.   he has undergone workup for stroke including echocardiogram and carotid angio.  The patient has been monitored on telemetry which has demonstrated sinus rhythm with no arrhythmias.  Inpatient stroke work-up is to be completed with a TEE.    Echocardiogram this admission demonstrated IMPRESSIONS  1. Left ventricular ejection fraction, by visual estimation, is 60 to 65%. The left ventricle has normal function. Normal left ventricular size. There is no left ventricular hypertrophy.  2. Left ventricular diastolic Doppler parameters are consistent with impaired relaxation pattern of LV diastolic filling.  3. Global right ventricle has normal systolic function.The right ventricular size is normal. No increase in right ventricular wall thickness.  4. Left atrial size was normal.  5. Right atrial size was normal.  6. The mitral valve is normal in structure. No evidence of mitral valve regurgitation. No evidence of mitral stenosis.  7. The tricuspid valve is normal in structure. Tricuspid valve regurgitation  was not visualized by color flow Doppler.  8. The aortic valve is normal in structure. Aortic valve regurgitation was not visualized by color flow Doppler. Structurally normal aortic valve, with no evidence of sclerosis or stenosis.  9. The pulmonic valve was normal in structure. Pulmonic valve regurgitation is not visualized by color flow Doppler. 10. The inferior vena cava is normal in size with greater than 50% respiratory variability, suggesting right atrial pressure of 3 mmHg. 11. No embolic source identified.    Lab work is reviewed.    Prior to admission, the patient denies chest pain, shortness of breath, dizziness, palpitations, or syncope.  They are recovering from their stroke with plans to home at discharge.      Surgical History: History reviewed. No pertinent surgical history.   Medications Prior to Admission  Medication Sig Dispense Refill Last Dose   OVER THE COUNTER MEDICATION Take 1 tablet by mouth daily. Natural Thyroid   06/03/2019 at Unknown time    Inpatient Medications:   aspirin EC  81 mg Oral Daily   atorvastatin  40 mg Oral q1800   Chlorhexidine Gluconate Cloth  6 each Topical Q0600   clopidogrel  75 mg Oral Daily   influenza vac split quadrivalent PF  0.5 mL Intramuscular Tomorrow-1000   pantoprazole  40 mg Oral Daily    Allergies:  Allergies  Allergen Reactions   Codeine Nausea And Vomiting    Social History   Socioeconomic History   Marital status: Single    Spouse name: Not on file   Number of children: Not on file   Years of education: Not on file   Highest education level: Not on file  Occupational History   Not on file  Social Needs  Financial resource strain: Not on file   Food insecurity    Worry: Not on file    Inability: Not on file   Transportation needs    Medical: Not on file    Non-medical: Not on file  Tobacco Use   Smoking status: Never Smoker   Smokeless tobacco: Never Used  Substance and Sexual  Activity   Alcohol use: Not on file   Drug use: Not on file   Sexual activity: Not on file  Lifestyle   Physical activity    Days per week: Not on file    Minutes per session: Not on file   Stress: Not on file  Relationships   Social connections    Talks on phone: Not on file    Gets together: Not on file    Attends religious service: Not on file    Active member of club or organization: Not on file    Attends meetings of clubs or organizations: Not on file    Relationship status: Not on file   Intimate partner violence    Fear of current or ex partner: Not on file    Emotionally abused: Not on file    Physically abused: Not on file    Forced sexual activity: Not on file  Other Topics Concern   Not on file  Social History Narrative   Not on file     History reviewed. No pertinent family history.    Review of Systems: General: No chills, fever, night sweats or weight changes  Cardiovascular:  No chest pain, dyspnea on exertion, edema, orthopnea, palpitations, paroxysmal nocturnal dyspnea Dermatological: No rash, lesions or masses Respiratory: No cough, dyspnea Urologic: No hematuria, dysuria Abdominal: No nausea, vomiting, diarrhea, bright red blood per rectum, melena, or hematemesis Neurologic: No visual changes, weakness, changes in mental status All other systems reviewed and are otherwise negative except as noted above.  Physical Exam: Vitals:   06/05/19 2059 06/06/19 0004 06/06/19 0415 06/06/19 0914  BP:  (!) 117/96 102/61 134/84  Pulse:  68 (!) 59 63  Resp:  18 18 18   Temp:  98 F (36.7 C) 98.4 F (36.9 C) 97.8 F (36.6 C)  TempSrc:  Axillary Axillary Oral  SpO2: 97% 93% 98% 100%  Weight:      Height:        GEN- The patient is well appearing, alert and oriented x 3 today.   Head- normocephalic, atraumatic Eyes-  Sclera clear, conjunctiva pink Ears- hearing intact Oropharynx- clear Neck- supple Lungs- Clear to ausculation bilaterally,  normal work of breathing Heart- Regular rate and rhythm, no murmurs, rubs or gallops  GI- soft, NT, ND, + BS Extremities- no clubbing, cyanosis, or edema MS- no significant deformity or atrophy Skin- no rash or lesion Psych- euthymic mood, full affect   Labs:   Lab Results  Component Value Date   WBC 8.4 06/06/2019   HGB 18.4 (H) 06/06/2019   HCT 58.0 (H) 06/06/2019   MCV 93.4 06/06/2019   PLT 155 06/06/2019    Recent Labs  Lab 06/04/19 1153  06/06/19 0421  NA 139   < > 136  K 4.5   < > 4.4  CL 102   < > 101  CO2 27   < > 27  BUN 18   < > 16  CREATININE 1.28*   < > 1.02  CALCIUM 9.5   < > 8.7*  PROT 7.2  --   --   BILITOT 1.8*  --   --  ALKPHOS 62  --   --   ALT 20  --   --   AST 20  --   --   GLUCOSE 118*   < > 88   < > = values in this interval not displayed.   No results found for: CKTOTAL, CKMB, CKMBINDEX, TROPONINI Lab Results  Component Value Date   CHOL 190 06/06/2019   Lab Results  Component Value Date   HDL 35 (L) 06/06/2019   Lab Results  Component Value Date   LDLCALC 134 (H) 06/06/2019   Lab Results  Component Value Date   TRIG 104 06/06/2019   Lab Results  Component Value Date   CHOLHDL 5.4 06/06/2019   No results found for: LDLDIRECT  No results found for: DDIMER   Radiology/Studies:   Ct Angio Head W Or Wo Contrast Result Date: 06/04/2019 CLINICAL DATA:  Code stroke.  Left-sided weakness. EXAM: CT ANGIOGRAPHY HEAD AND NECK CT PERFUSION BRAIN TECHNIQUE: Multidetector CT imaging of the head and neck was performed using the standard protocol during bolus administration of intravenous contrast. Multiplanar CT image reconstructions and MIPs were obtained to evaluate the vascular anatomy. Carotid stenosis measurements (when applicable) are obtained utilizing NASCET criteria, using the distal internal carotid diameter as the denominator. Multiphase CT imaging of the brain was performed following IV bolus contrast injection. Subsequent  parametric perfusion maps were calculated using RAPID software. CONTRAST:  OMNIPAQUE IOHEXOL 350 MG/ML SOLN COMPARISON:  Head CT earlier same day FINDINGS: CTA NECK FINDINGS Aortic arch: Aortic atherosclerosis. No aneurysm or dissection. Branching pattern is normal. Right carotid system: Common carotid artery widely patent to the bifurcation. Soft and calcified plaque at the carotid bifurcation and ICA bulb but no stenosis. Cervical ICA is tortuous but widely patent to the skull base. Left carotid system: Common carotid artery widely patent to the bifurcation. Soft and calcified plaque at the bifurcation and ICA bulb. No stenosis. Cervical ICA is tortuous but widely patent to the skull base. Vertebral arteries: Each vertebral artery origin appears widely patent. Both vertebral arteries appear normal through the cervical region to foramen magnum. Skeleton: Ordinary spondylosis. Other neck: No mass or lymphadenopathy. Upper chest: Negative Review of the MIP images confirms the above findings CTA HEAD FINDINGS Anterior circulation: Both internal carotid arteries are patent through the skull base and siphon regions. There is atherosclerotic calcification in the carotid siphon regions but no stenosis greater than 30%. The anterior and middle cerebral vessels show flow without large or medium vessel occlusion. Distal branch vessels do show atherosclerotic irregularity. Posterior circulation: Both vertebral arteries are patent at the foramen magnum. There is atherosclerotic disease within the right V4 segment with irregular stenosis estimated at 50%. Beyond that the vessel is widely patent to the basilar. No basilar stenosis. Posterior circulation branch vessels remain patent. Venous sinuses: Patent and normal. Anatomic variants: None significant. Review of the MIP images confirms the above findings CT Brain Perfusion Findings: ASPECTS: 10 CBF (<30%) Volume: Perfusion (Tmax>6.0s) volume: . I think this  reflects poor cardiac output also related to diffuse small vessel disease. Infarction Location:No infarct suspected. IMPRESSION: No large or medium vessel occlusion. Atherosclerotic disease at both carotid bifurcations but without stenosis. Intracranial small vessel atherosclerotic irregularity. Irregular 50% stenosis of the right vertebral artery V4 segment. No other posterior circulation finding. Perfusion study does not suggest any acute insult. There is a large amount of brain with T-max greater than 6 seconds, but this is felt to reflect diminished cardiac output  and small-vessel disease. Electronically Signed   By: Paulina FusiMark  Shogry M.D.   On: 06/04/2019 13:01    Mr Brain Wo Contrast Result Date: 06/05/2019 CLINICAL DATA:  Stroke follow-up.  Word-finding difficulty EXAM: MRI HEAD WITHOUT CONTRAST TECHNIQUE: Multiplanar, multiecho pulse sequences of the brain and surrounding structures were obtained without intravenous contrast. COMPARISON:  Stroke workup including CTA from yesterday FINDINGS: Brain: No acute infarction, hemorrhage, hydrocephalus, extra-axial collection or mass lesion. Vascular: Normal flow voids Skull and upper cervical spine: Negative for marrow lesion. Patchy scalp scarring. Sinuses/Orbits: Negative Other: Mild motion degradation. IMPRESSION: Normal brain MRI. Electronically Signed   By: Marnee SpringJonathon  Watts M.D.   On: 06/05/2019 12:00    Dg Chest Port 1 View Result Date: 06/04/2019 CLINICAL DATA:  Shortness of breath. EXAM: PORTABLE CHEST 1 VIEW COMPARISON:  None. FINDINGS: There is minimal left basilar atelectasis. Lungs otherwise clear. Heart size is normal. Atherosclerosis noted. No pneumothorax or pleural fluid. No acute or focal bony abnormality. IMPRESSION: No acute disease. Atherosclerosis. Electronically Signed   By: Drusilla Kannerhomas  Dalessio M.D.   On: 06/04/2019 15:00    Ct Head Code Stroke Wo Contrast Result Date: 06/04/2019 CLINICAL DATA:  Code stroke. Aphasia. Left-sided weakness.  Last seen normal 1.5 hours ago. EXAM: CT HEAD WITHOUT CONTRAST TECHNIQUE: Contiguous axial images were obtained from the base of the skull through the vertex without intravenous contrast. COMPARISON:  None. FINDINGS: Brain: No acute cortical infarct is present. Basal ganglia are intact. Insular ribbon is normal. The ventricles are of normal size. No significant extraaxial fluid collection is present. The brainstem and cerebellum are within normal limits. Vascular: Atherosclerotic calcifications are present within the cavernous internal carotid arteries and at the dural margin of both vertebral arteries. Vessels are mildly dense throughout. There is no significant asymmetry. Skull: Calvarium is intact. No focal lytic or blastic lesions are present. Sinuses/Orbits: The paranasal sinuses and mastoid air cells are clear. The globes and orbits are within normal limits. ASPECTS [facility]([place] Stroke Program Early CT Score) - Ganglionic level infarction (caudate, lentiform nuclei, internal capsule, insula, M1-M3 cortex): 7/7 - Supraganglionic infarction (M4-M6 cortex): 3/3 Total score (0-10 with 10 being normal): 10/10 IMPRESSION: 1. Normal CT appearance of the brain.  No acute infarct evident. 2. Atherosclerotic changes noted within the cavernous internal carotid arteries and at the dural margin of the vertebral arteries bilaterally without asymmetric hyperdense vessel 3. ASPECTS is 10/10 The above was relayed via text pager to Dr. Lebron ConnersMARCY PFEIFFER on 06/04/2019 at 12:37 . Electronically Signed   By: Marin Robertshristopher  Mattern M.D.   On: 06/04/2019 12:38    Vas [place] Lower Extremity Venous (dvt) Result Date: 06/05/2019  Lower Venous Study Indications: Stroke.  Comparison Study: no prior Performing Technologist: Jeb LeveringJill Parker RDMS, RVT  Examination Guidelines: A complete evaluation includes B-mode imaging, spectral Doppler, color Doppler, and power Doppler as needed of all accessible portions of each vessel. Bilateral testing is  considered an integral part of a complete examination. Limited examinations for reoccurring indications may be performed as noted.  Summary: Right: There is no evidence of deep vein thrombosis in the lower extremity. No cystic structure found in the popliteal fossa. Left: There is no evidence of deep vein thrombosis in the lower extremity. No cystic structure found in the popliteal fossa.  *See table(s) above for measurements and observations. Electronically signed by Waverly Ferrarihristopher Dickson MD on 06/05/2019 at 4:14:35 PM.    Final     12-lead ECG SR All prior EKG's in EPIC reviewed with no  documented atrial fibrillation  Telemetry - nsr  Assessment and Plan:  1. Cryptogenic stroke The patient presents with cryptogenic stroke.  The patient has a TEE planned for this AM.  I spoke at length with the patient about monitoring for afib with either a 30 day event monitor or an implantable loop recorder.  Risks, benefits, and alteratives to implantable loop recorder were discussed with the patient today.   At this time, the patient is very clear in their decision to proceed with implantable loop recorder.   Wound care was reviewed with the patient (keep incision clean and dry for 3 days).  Wound check scheduled for   Please call with questions.   Renee Dyane Dustman, PA-C 06/06/2019  EP Attending  Patient seen and examined. Agree with the findings as noted above. The patient has recovered from a cryptogenic stroke and we will undergo TEE and if no etiology for the stroke we will plan for ILR insertion.  Mikle Bosworth.D.

## 2019-06-06 NOTE — Interval H&P Note (Signed)
History and Physical Interval Note:  06/06/2019 1:12 PM  Brian Randall  has presented today for surgery, with the diagnosis of TIA.  The various methods of treatment have been discussed with the patient and family. After consideration of risks, benefits and other options for treatment, the patient has consented to  Procedure(s): LOOP RECORDER INSERTION (N/A) as a surgical intervention.  The patient's history has been reviewed, patient examined, no change in status, stable for surgery.  I have reviewed the patient's chart and labs.  Questions were answered to the patient's satisfaction.     Cristopher Peru

## 2019-06-07 ENCOUNTER — Encounter: Payer: Self-pay | Admitting: Physician Assistant

## 2019-06-07 ENCOUNTER — Encounter (HOSPITAL_COMMUNITY): Payer: Self-pay | Admitting: Internal Medicine

## 2019-06-07 NOTE — Progress Notes (Signed)
Late entry for missed modified Rankin Score.  Based on review of medical record.    06/06/19 1247  Modified Rankin (Stroke Patients Only)  Pre-Morbid Rankin Score 0  Modified Rankin [place], [place]   Acute Rehabilitation Services  Pager [phone] Office [phone] 06/07/2019

## 2019-06-18 ENCOUNTER — Encounter: Payer: Self-pay | Admitting: Adult Health

## 2019-06-19 ENCOUNTER — Telehealth: Payer: Self-pay | Admitting: Internal Medicine

## 2019-06-19 NOTE — Telephone Encounter (Signed)
Pt wife called back and she just wanted to know if they should send a manual transmission. I informed the pt wife that the pt monitor automatically sends. The pt has no way of sending a manual transmission with his home monitor.

## 2019-06-19 NOTE — Telephone Encounter (Signed)
The patient has questions pertaining to his LINQ.  Please call to advise, thank you.

## 2019-06-19 NOTE — Telephone Encounter (Signed)
New message:    Patient wife calling and would like for some one to call her concering her husband monitor. Please call wife back.

## 2019-06-19 NOTE — Telephone Encounter (Signed)
I spoke with the pt son and he got the monitor set up. The pt wife do not believe him when he told her the pt do not need to walk around with his home monitor. He asked me to call the pt wife.   I called the pt wife and left a voicemail with my direct office number.

## 2019-06-21 ENCOUNTER — Other Ambulatory Visit: Payer: Self-pay

## 2019-06-21 ENCOUNTER — Ambulatory Visit (INDEPENDENT_AMBULATORY_CARE_PROVIDER_SITE_OTHER): Payer: BC Managed Care – PPO | Admitting: *Deleted

## 2019-06-21 DIAGNOSIS — I639 Cerebral infarction, unspecified: Secondary | ICD-10-CM

## 2019-06-21 LAB — CUP PACEART INCLINIC DEVICE CHECK
Date Time Interrogation Session: 20201008171507
Implantable Pulse Generator Implant Date: 20200923

## 2019-06-21 NOTE — Progress Notes (Signed)
ILR wound check in clinic. Steri strips removed. Wound well healed. R-waves 0.22. Home monitor transmitting nightly. No episodes. Questions answered.

## 2019-07-04 ENCOUNTER — Telehealth: Payer: Self-pay | Admitting: *Deleted

## 2019-07-04 ENCOUNTER — Encounter: Payer: Self-pay | Admitting: Adult Health

## 2019-07-04 ENCOUNTER — Ambulatory Visit: Payer: BC Managed Care – PPO | Admitting: Adult Health

## 2019-07-04 ENCOUNTER — Other Ambulatory Visit: Payer: Self-pay

## 2019-07-04 VITALS — BP 115/71 | HR 63 | Temp 97.8°F | Ht 72.0 in | Wt 301.4 lb

## 2019-07-04 DIAGNOSIS — G4733 Obstructive sleep apnea (adult) (pediatric): Secondary | ICD-10-CM | POA: Diagnosis not present

## 2019-07-04 DIAGNOSIS — E785 Hyperlipidemia, unspecified: Secondary | ICD-10-CM | POA: Diagnosis not present

## 2019-07-04 DIAGNOSIS — I10 Essential (primary) hypertension: Secondary | ICD-10-CM | POA: Diagnosis not present

## 2019-07-04 DIAGNOSIS — G459 Transient cerebral ischemic attack, unspecified: Secondary | ICD-10-CM | POA: Diagnosis not present

## 2019-07-04 DIAGNOSIS — F411 Generalized anxiety disorder: Secondary | ICD-10-CM

## 2019-07-04 DIAGNOSIS — Z9989 Dependence on other enabling machines and devices: Secondary | ICD-10-CM

## 2019-07-04 MED ORDER — ESCITALOPRAM OXALATE 10 MG PO TABS: 10.0000 mg | ORAL_TABLET | Freq: Every day | ORAL | 3 refills | Status: AC

## 2019-07-04 NOTE — Progress Notes (Signed)
I agree with the above plan 

## 2019-07-04 NOTE — Patient Instructions (Addendum)
Start Lexapro 10mg  nightly for anxiety - follow up with PCP regarding possible need of increase - may take 4-6 weeks to take effect  Referral placed to establish care with one of our sleep provider for sleep apnea management - you will be called to schedule initial visit  Continue aspirin 81 mg daily  and Lipitor for secondary stroke prevention  You can use Tylenol (acetaminophen) for pain or headaches  Continue to follow up with PCP regarding cholesterol and blood pressure management   Continue to monitor blood pressure at home  Maintain strict control of hypertension with blood pressure goal below 130/90, diabetes with hemoglobin A1c goal below 6.5% and cholesterol with LDL cholesterol (bad cholesterol) goal below 70 mg/dL. I also advised the patient to eat a healthy diet with plenty of whole grains, cereals, fruits and vegetables, exercise regularly and maintain ideal body weight.  Followup in the future with me in 4 months or call earlier if needed       Thank you for coming to see [place] at [facility]. I hope we have been able to provide you high quality care today.  You may receive a patient satisfaction survey over the next few weeks. We would appreciate your feedback and comments so that we may continue to improve ourselves and the health of our patients.

## 2019-07-04 NOTE — Progress Notes (Signed)
[facility] [address] [place]. [place] [address] [phone]       HOSPITAL FOLLOW UP NOTE  [name] Date of Birth:  07-Apr-1961 Medical Record Number:  [id number]   Reason for Referral:  hospital stroke follow up    CHIEF COMPLAINT:  Chief Complaint  Patient presents with  . Hospitalization Follow-up    Wife present. Rm 9. Patient would like to know what he can take for a headache when he has one. No headache at present.     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  History obtained from patient, wife and chart review. Reviewed all radiology images and labs personally.  Mr.Brian Gaspar Bidding Jonesis a 58 y.o.malewith history of HTN, Hypothyroidism, hiatal hernia, BPH, dysphagia with esophageal polypwho presented on 06/03/2019 withglobal aphasia and right lower quadrantanopia. Stroke work-up showed left brain cortical TIA status post TPA however migraine equivalent cannot be ruled out.  CTA negative for LVO but did show bilateral ICA bifurcation atherosclerosis, intracranial small vessel disease and right VA 50% irregular stenosis.  CT perfusion no acute infarct.  MRI.  Lower extremity Doppler negative for DVT.  2D echo normal EF without cardiac source of embolus identified.  Unable to perform TEE given history of esophageal stricture with recent dilation.  Loop recorder placed on 06/06/2019 for possible atrial fibrillation as TIA etiology.  HIV negative.  LDL 134.  A1c 5.3.  Recommended DAPT for 3 weeks and aspirin alone.  HTN stable.  Initiated atorvastatin 40 mg daily for HLD management.  History of OSA with CPAP compliance.  Other stroke risk factors include polycythemia and history of migraines but no prior history of stroke.  He was discharged home in stable condition without therapy needs.   [name] is being seen today for hospital follow-up accompanied  by his wife.  He has been stable since hospital discharge without recurring or new stroke/TIA symptoms.  He has had a couple headaches over the past week located right occipital area but dull in nature and typical duration 30 minutes.  He denies reoccurring migrainous features.  Loop recorder has not shown atrial fibrillation thus far.  Completed 3 weeks DAPT and continues on aspirin alone without bleeding or bruising.  Continues on atorvastatin without myalgias.  Blood pressure today 115/71.  He endorses ongoing compliance with CPAP for OSA management.  He does endorse increased anxiety especially with use of CPAP and over the past week has been having greater difficulty tolerating.  He is requesting to establish care with provider in the [place] area for his OSA management.  Prior sleep study "years ago".  Recently started on Wellbutrin for anxiety by PCP but he has not started at this time as he "lost prescription".  No further concerns at this time.     ROS:   14 system review of systems performed and negative with exception of anxiety and apnea  PMH:  Past Medical History:  Diagnosis Date  . Kidney stones    never been diagnosed but pt states he passed one  . Spider bite wound    left wrist  . Umbilical hernia     PSH:  Past Surgical History:  Procedure Laterality Date  . LOOP RECORDER INSERTION N/A 06/06/2019   Procedure: LOOP RECORDER INSERTION;  Surgeon: Evans Lance, MD;  Location: [facility];  Service: Cardiovascular;  Laterality: N/A;  . UMBILICAL HERNIA  REPAIR      Social History:  Social History   Socioeconomic History  . Marital status: Single    Spouse name: Not on file  . Number of children: 2  . Years of education: Not on file  . Highest education level: Not on file  Occupational History  . Occupation: Administrator  Social Needs  . Financial resource strain: Not on file  . Food insecurity    Worry: Not on file    Inability: Not on file  .  Transportation needs    Medical: Not on file    Non-medical: Not on file  Tobacco Use  . Smoking status: Never Smoker  . Smokeless tobacco: Never Used  Substance and Sexual Activity  . Alcohol use: Yes    Alcohol/week: 0.0 standard drinks    Comment: occasional beer or wine but rare  . Drug use: No  . Sexual activity: Not on file  Lifestyle  . Physical activity    Days per week: Not on file    Minutes per session: Not on file  . Stress: Not on file  Relationships  . Social Musician on phone: Not on file    Gets together: Not on file    Attends religious service: Not on file    Active member of club or organization: Not on file    Attends meetings of clubs or organizations: Not on file    Relationship status: Not on file  . Intimate partner violence    Fear of current or ex partner: Not on file    Emotionally abused: Not on file    Physically abused: Not on file    Forced sexual activity: Not on file  Other Topics Concern  . Not on file  Social History Narrative   ** Merged History Encounter **       Patient reports that he is divorced. He has a girlfriend in the [place] area. 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 Dementia Mother   . Diabetes Brother   . Breast cancer Mother   . Cancer Maternal Aunt        type unknown  . Cancer Maternal Grandmother        type unknown    Medications:   Current Outpatient Medications on File Prior to Visit  Medication Sig Dispense Refill  . aspirin 81 MG EC tablet Take 1 tablet (81 mg total) by mouth daily. 30 tablet   . atorvastatin (LIPITOR) 40 MG tablet Take 1 tablet (40 mg total) by mouth daily at 6 PM. 30 tablet 2  . OVER THE COUNTER MEDICATION Take 1 tablet by mouth daily. Natural Thyroid    . Thyroid 97.5 MG TABS Take 97.5 mg by mouth daily.     No current  facility-administered medications on file prior to visit.     Allergies:   Allergies  Allergen Reactions  . Codeine Nausea And Vomiting  . Codeine Nausea Only     Physical Exam  Vitals:   07/04/19 0852  BP: 115/71  Pulse: 63  Temp: 97.8 F (36.6 C)  TempSrc: Oral  Weight: (!) 301 lb 6.4 oz (136.7 kg)  Height: 6' (1.829 m)   Body mass index is 40.88 kg/m. No exam data present  General: well developed, well nourished,  pleasant middle-age Caucasian male, seated,  in no evident distress Head: head normocephalic and atraumatic.   Neck: supple with no carotid or supraclavicular bruits Cardiovascular: regular rate and rhythm, no murmurs Musculoskeletal: no deformity Skin:  no rash/petichiae Vascular:  Normal pulses all extremities   Neurologic Exam Mental Status: Awake and fully alert. Oriented to place and time. Recent and remote memory intact. Attention span, concentration and fund of knowledge appropriate. Mood and affect appropriate.  Cranial Nerves: Fundoscopic exam reveals sharp disc margins. Pupils equal, briskly reactive to light. Extraocular movements full without nystagmus. Visual fields full to confrontation. Hearing intact. Facial sensation intact. Face, tongue, palate moves normally and symmetrically.  Motor: Normal bulk and tone. Normal strength in all tested extremity muscles. Sensory.: intact to touch , pinprick , position and vibratory sensation.  Coordination: Rapid alternating movements normal in all extremities. Finger-to-nose and heel-to-shin performed accurately bilaterally. Gait and Station: Arises from chair without difficulty. Stance is normal. Gait demonstrates normal stride length and balance Reflexes: 1+ and symmetric. Toes downgoing.     NIHSS  0 Modified Rankin  0    Diagnostic Data (Labs, Imaging, Testing)  CT HEAD WO CONTRAST 06/04/2019 IMPRESSION: 1. Normal CT appearance of the brain.  No acute infarct evident. 2. Atherosclerotic  changes noted within the cavernous internal carotid arteries and at the dural margin of the vertebral arteries bilaterally without asymmetric hyperdense vessel 3. ASPECTS is 10/10  CT ANGIO HEAD W OR WO CONTRAST CT ANGIO NECK W OR WO CONTRAST CT CEREBRAL PERFUSION W CONTRAST 06/04/2019 IMPRESSION: No large or medium vessel occlusion.  Atherosclerotic disease at both carotid bifurcations but without stenosis.  Intracranial small vessel atherosclerotic irregularity.  Irregular 50% stenosis of the right vertebral artery V4 segment. No other posterior circulation finding.  Perfusion study does not suggest any acute insult. There is a large amount of brain with T-max greater than 6 seconds, but this is felt to reflect diminished cardiac output and small-vessel disease.  MR BRAIN WO CONTRAST 06/05/2019 IMPRESSION: Normal brain MRI.   ECHOCARDIOGRAM 06/05/2019 1. Left ventricular ejection fraction, by visual estimation, is 60 to 65%. The left ventricle has normal function. Normal left ventricular size. There is no left ventricular hypertrophy. 2. Left ventricular diastolic Doppler parameters are consistent with impaired relaxation pattern of LV diastolic filling. 3. Global right ventricle has normal systolic function.The right ventricular size is normal. No increase in right ventricular wall thickness. 4. Left atrial size was normal. 5. Right atrial size was normal. 6. The mitral valve is normal in structure. No evidence of mitral valve regurgitation. No evidence of mitral stenosis. 7. The tricuspid valve is normal in structure. Tricuspid valve regurgitation was not visualized by color flow Doppler. 8. The aortic valve is normal in structure. Aortic valve regurgitation was not visualized by color flow Doppler. Structurally normal aortic valve, with no evidence of sclerosis or stenosis. 9. The pulmonic valve was normal in structure. Pulmonic valve regurgitation is not  visualized by color flow Doppler. 10. The inferior vena cava is normal in size with greater than 50% respiratory variability, suggesting right atrial pressure of 3 mmHg. 11. No embolic source identified.   ASSESSMENT: Brian Randall is a 58 y.o. year old male presented with global aphasia and right lower quadrantanopsia on 06/04/2019 with stroke work-up left brain cortical TIA status post TPA secondary to undetermined etiology therefore loop recorder placed to assess for atrial fibrillation.  History of migraines and migraine equivalent cannot be ruled out. Vascular risk factors include migraines, HTN, HLD, OSA on  CPAP and polycythemia.     PLAN:  1. TIA: Continue aspirin 81 mg daily  and atorvastatin for secondary stroke prevention. Maintain strict control of hypertension with blood pressure goal below 130/90, diabetes with hemoglobin A1c goal below 6.5% and cholesterol with LDL cholesterol (bad cholesterol) goal below 70 mg/dL.  I also advised the patient to eat a healthy diet with plenty of whole grains, cereals, fruits and vegetables, exercise regularly with at least 30 minutes of continuous activity daily and maintain ideal body weight.  Continue to monitor loop recorder for atrial fibrillation. 2. HTN: Advised to continue current treatment regimen.  Today's BP stable.  Advised to continue to monitor at home along with continued follow-up with PCP for management 3. HLD: Advised to continue current treatment regimen.  He plans on having lab work completed on 07/12/2019 prior to follow-up with PCP -request lipid panel obtained at that time to ensure adequate management 4. OSA on CPAP: Referral placed to [facility] sleep clinic to establish care with sleep provider 5. Headaches: Appear to be tension related without migrainous features.  Advised to use acetaminophen for pain relief if needed 6. Anxiety: Initiate Lexapro 10 mg nightly due to worsening anxiety and difficulty tolerating CPAP due to  claustrophobia feeling.  Advised to follow-up with PCP for ongoing management and monitoring.   Follow up in 4 months or call earlier if needed   Greater than 50% of time during this 45 minute visit was spent on counseling, explanation of diagnosis of TIA, reviewing risk factor management of HTN, HLD and OSA on CPAP, discussion regarding headaches, discussion regarding anxiety, planning of further management along with potential future management, and discussion with patient and family answering all questions.    Ihor Austin, AGNP-BC  [facility] [address] [place], [place] [address]  Phone [phone] Fax [phone] Note: This document was prepared with digital dictation and possible smart phrase technology. Any transcriptional errors that result from this process are unintentional.

## 2019-07-11 ENCOUNTER — Encounter: Payer: Self-pay | Admitting: Neurology

## 2019-07-11 ENCOUNTER — Ambulatory Visit: Payer: BC Managed Care – PPO | Admitting: Neurology

## 2019-07-11 ENCOUNTER — Other Ambulatory Visit: Payer: Self-pay

## 2019-07-11 VITALS — BP 115/71 | HR 73 | Temp 97.5°F | Ht 71.5 in | Wt 303.0 lb

## 2019-07-11 DIAGNOSIS — G459 Transient cerebral ischemic attack, unspecified: Secondary | ICD-10-CM

## 2019-07-11 DIAGNOSIS — G4733 Obstructive sleep apnea (adult) (pediatric): Secondary | ICD-10-CM | POA: Diagnosis not present

## 2019-07-11 DIAGNOSIS — Z789 Other specified health status: Secondary | ICD-10-CM

## 2019-07-11 DIAGNOSIS — Z820 Family history of epilepsy and other diseases of the nervous system: Secondary | ICD-10-CM

## 2019-07-11 DIAGNOSIS — Z6841 Body Mass Index (BMI) 40.0 and over, adult: Secondary | ICD-10-CM

## 2019-07-11 NOTE — Patient Instructions (Signed)
Thank you for choosing [facility] for your sleep related care! It was nice to meet you today! I appreciate that you entrust me with your sleep related healthcare concerns. I hope, I was able to address at least some of your concerns today, and that I can help you feel reassured and also get better.    Here is what we discussed today and what we came up with as our plan for you:    Based on your symptoms and your exam I believe you are still at risk for obstructive sleep apnea and would benefit from reevaluation as it has been some years and you need new supplies and maybe a different machine, rather than autoBiPAP. Therefore, I think we should proceed with a sleep study to determine how severe your sleep apnea is. If you have more than mild OSA, I want you to consider ongoing treatment with CPAP. Please remember, the risks and ramifications of moderate to severe obstructive sleep apnea or OSA are: Cardiovascular disease, including congestive heart failure, stroke, difficult to control hypertension, arrhythmias, and even type 2 diabetes has been linked to untreated OSA. Sleep apnea causes disruption of sleep and sleep deprivation in most cases, which, in turn, can cause recurrent headaches, problems with memory, mood, concentration, focus, and vigilance. Most people with untreated sleep apnea report excessive daytime sleepiness, which can affect their ability to drive. Please do not drive if you feel sleepy.   I will likely see you back after your sleep study to go over the test results and where to go from there. We will call you after your sleep study to advise about the results (most likely, you will hear from [place], my nurse) and to set up an appointment at the time, as necessary.    Our sleep lab administrative assistant will call you to schedule your sleep study. If you don't hear back from her by about 2 weeks from now, please feel free to call her at [phone]. You can leave  a message with your phone number and concerns, if you get the voicemail box. She will call back as soon as possible.

## 2019-07-11 NOTE — Progress Notes (Signed)
Subjective:    Patient ID: Brian PaganiniGarry Bryan Randall is a 58 y.o. male.  HPI    Brian FoleySaima Casey Maxfield, MD, [name], [name], [place] [id number]  Dear Brian BumpsJessica, [name], Brian Randall, upon your kind request to my sleep clinic today for initial consultation of his sleep disorder, in particular, evaluation of his prior diagnosis of obstructive sleep apnea.  The patient is unaccompanied today.  As you know, Brian Randall is a 58 year old right-handed gentleman with an underlying medical history of hypertension, hypothyroidism, history of TIA in September 2020, history of dysphagia secondary to esophageal polyp, BPH, and morbid obesity with a BMI of over 40, who was previously diagnosed with obstructive sleep apnea and has been on BiPAP therapy.  Prior sleep study results are not available for my review today.  I reviewed your office note from 07/04/2019.  I reviewed his auto BiPAP compliance data from the past 90 days, from 04/10/2019 through 07/08/2019, during which time he used his machine 39 out of 90 days with percent use days greater than 4 hours at 26% only, indicating low compliance with an average usage for days on treatment of 4 hours and 53 minutes, residual AHI mildly elevated at 10.3/h, leak quite high with a 95th percentile at 64.2 L/min on a maximum IPAP of 17, minimum EPAP of 4, pressure support of 4.  His Epworth sleepiness score is 7 out of 24, fatigue severity score is 23 out of 63.  He recalls being told that he had a breathing event 30 or 40 times per hour.  He finds the pressure uncomfortable, wonders if the settings are right.  He becomes claustrophobic and sometimes has to pull off the mask because he feels hot and enclosed.  He uses a full facemask.  He is a restless sleeper.  He has 2 residences, he spends the workweek in [place] at his house and the weekend at his house in [place] where his wife also has a business.  He has to pack  up the machine twice a week which is bothersome to him.  His brother has sleep apnea and uses a CPAP machine.  Patient's weight has been fluctuating.  He feels largely back to baseline.  His bedtime is between 1030 and 11 and rise time around 530.  At his home in [place] they have 2 dogs.  He has 2 grown children, his son lives with him in [place] as he also works in the business with him.  The patient is a non-smoker and drinks alcohol occasionally in the form of beer maybe once a week and caffeine occasionally in the form of tea.  He would be willing to get retested with a sleep study and consider CPAP therapy.  He would like to find something that he can tolerate better and find a mask that he can keep on without feeling panicky.  He does not have night to night nocturia.  His sleep study was either in 2015 or 16 by his estimation, [place].  His Past Medical History Is Significant For: Past Medical History:  Diagnosis Date  . Kidney stones    never been diagnosed but pt states he passed one  . Spider bite wound    left wrist  . Umbilical hernia     His Past Surgical History Is Significant For: Past Surgical History:  Procedure Laterality Date  . LOOP RECORDER INSERTION N/A 06/06/2019   Procedure: LOOP  RECORDER INSERTION;  Surgeon: Brian Lance, MD;  Location: [facility];  Service: Cardiovascular;  Laterality: N/A;  . UMBILICAL HERNIA REPAIR      His Family History Is Significant For: Family History  Problem Relation Age of Onset  . Dementia Mother   . Diabetes Brother   . Breast cancer Mother   . Cancer Maternal Aunt        type unknown  . Cancer Maternal Grandmother        type unknown    His Social History Is Significant For: Social History   Socioeconomic History  . Marital status: Single    Spouse name: Not on file  . Number of children: 2  . Years of education: Not on file  . Highest education level: Not on file  Occupational History   . Occupation: Development worker, international aid  Social Needs  . Financial resource strain: Not on file  . Food insecurity    Worry: Not on file    Inability: Not on file  . Transportation needs    Medical: Not on file    Non-medical: Not on file  Tobacco Use  . Smoking status: Never Smoker  . Smokeless tobacco: Never Used  Substance and Sexual Activity  . Alcohol use: Yes    Alcohol/week: 0.0 standard drinks    Comment: occasional beer or wine but rare  . Drug use: No  . Sexual activity: Not on file  Lifestyle  . Physical activity    Days per week: Not on file    Minutes per session: Not on file  . Stress: Not on file  Relationships  . Social Herbalist on phone: Not on file    Gets together: Not on file    Attends religious service: Not on file    Active member of club or organization: Not on file    Attends meetings of clubs or organizations: Not on file    Relationship status: Not on file  Other Topics Concern  . Not on file  Social History Narrative   ** Merged History Encounter **       Patient reports that he is divorced. He has a girlfriend in the [place] area. 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 Codeine Nausea And Vomiting  . Codeine Nausea Only  :   His Current Medications Are:  Outpatient Encounter Medications as of 07/11/2019  Medication Sig  . aspirin 81 MG EC tablet Take 1 tablet (81 mg total) by mouth daily.  Marland Kitchen atorvastatin (LIPITOR) 40 MG tablet Take 1 tablet (40 mg total) by mouth daily at 6 PM.  . cetirizine (ZYRTEC) 10 MG tablet Take 10 mg by mouth daily.  Marland Kitchen escitalopram (LEXAPRO) 10 MG tablet Take 1 tablet (10 mg total) by mouth at bedtime.  . fluticasone (FLONASE) 50 MCG/ACT nasal spray Place 1 spray into both nostrils daily.  . metFORMIN (GLUCOPHAGE) 500 MG tablet Take by mouth 2 (two) times daily with a meal.  . omeprazole  (PRILOSEC) 40 MG capsule Take 40 mg by mouth 2 (two) times daily.  . Thyroid 97.5 MG TABS Take 97.5 mg by mouth daily.   No facility-administered encounter medications on file as of 07/11/2019.   :  Review of Systems:  Out of a complete 14 point review of systems, all are reviewed and negative with the  exception of these symptoms as listed below: Review of Systems  Neurological:       Pt presents today to discuss his cpap. He thinks the cpap is about 60 years old. He is unsure of his DME. He does not use the cpap consistently because he is claustrophobic and it makes him hot. Pt does endorse snoring.  Epworth Sleepiness Scale 0= would never doze 1= slight chance of dozing 2= moderate chance of dozing 3= high chance of dozing  Sitting and reading: 2 Watching TV: 1 Sitting inactive in a public place (ex. Theater or meeting): 0 As a passenger in a car for an hour without a break: 1 Lying down to rest in the afternoon: 1 Sitting and talking to someone: 0 Sitting quietly after lunch (no alcohol): 1 In a car, while stopped in traffic: 1 Total: 7     Objective:  Neurological Exam  Physical Exam Physical Examination:   Vitals:   07/11/19 0850  BP: 115/71  Pulse: 73  Temp: (!) 97.5 F (36.4 C)    General Examination: The patient is a very pleasant 58 y.o. male in no acute distress. He appears well-developed and well-nourished and well groomed.   HEENT: Normocephalic, atraumatic, pupils are equal, round and reactive to light, Extraocular tracking is well preserved, hearing is grossly intact, no nystagmus is seen.  Face is symmetric with normal facial animation, speech is clear without hypophonia, voice tremor or dysarthria, airway examination reveals a Mallampati class III, he has a small airway entry, larger uvula, tonsils about 1+, neck circumference 19 and half inches.  Tongue protrudes centrally in palate elevates symmetrically, no carotid bruits.  Chest: Clear to  auscultation without wheezing, rhonchi or crackles noted.  Heart: S1+S2+0, regular and normal without murmurs, rubs or gallops noted.   Abdomen: Soft, non-tender and non-distended with normal bowel sounds appreciated on auscultation.  Extremities: There is trace edema in the distal lower extremities bilaterally.  Skin: Warm and dry without trophic changes noted.  Musculoskeletal: exam reveals no obvious joint deformities, tenderness or joint swelling or erythema.   Neurologically:  Mental status: The patient is awake, alert and oriented in all 4 spheres. His immediate and remote memory, attention, language skills and fund of knowledge are appropriate. There is no evidence of aphasia, agnosia, apraxia or anomia. Speech is clear with normal prosody and enunciation. Thought process is linear. Mood is normal and affect is normal.  Cranial nerves II - XII are as described above under HEENT exam. In addition: shoulder shrug is normal with equal shoulder height noted. Motor exam: Normal bulk, strength and tone is noted. There is no drift, tremor or rebound. Romberg is negative. Reflexes are 1+. Fine motor skills and coordination: intact with normal finger taps, normal hand movements, normal rapid alternating patting, normal foot taps and normal foot agility.  Cerebellar testing: No dysmetria or intention tremor. There is no truncal or gait ataxia.  Sensory exam: intact to light touch in the upper and lower extremities.  Gait, station and balance: He stands easily. No veering to one side is noted. No leaning to one side is noted. Posture is age-appropriate and stance is narrow based. Gait shows normal stride length and normal pace. No problems turning are noted. Tandem walk is unremarkable.   Assessment and Plan:  In summary, Romari Gasparro is a very pleasant 58 y.o.-year old male with an underlying medical history of hypertension, hypothyroidism, history of TIA in September 2020, history of  dysphagia secondary to  esophageal polyp, BPH, and morbid obesity with a BMI of over 40, who Presents for evaluation of his prior diagnosis of obstructive sleep apnea several years ago.  I had a long chat with the patient about my findings and the diagnosis of OSA, its prognosis and treatment options. We talked about medical treatments, surgical interventions and non-pharmacological approaches. I explained in particular the risks and ramifications of untreated moderate to severe OSA, especially with respect to developing cardiovascular disease down the Road, including congestive heart failure, difficult to treat hypertension, cardiac arrhythmias, or stroke. Even type 2 diabetes has, in part, been linked to untreated OSA. Symptoms of untreated OSA include daytime sleepiness, memory problems, mood irritability and mood disorder such as depression and anxiety, lack of energy, as well as recurrent headaches, especially morning headaches. We talked about trying to maintain a healthy lifestyle in general, as well as the importance of weight control. We also talked about the importance of good sleep hygiene. I recommended the following at this time: sleep study. He would be willing to consider CPAP therapy, he may benefit from CPAP versus AutoPap or BiPAP.  He is currently on auto BiPAPBut has trouble tolerating it. I explained the sleep test procedure to the patient and also outlined possible surgical and non-surgical treatment options of OSA. I explained the importance of being compliant with PAP treatment, not only for insurance purposes but primarily to improve His symptoms, and for the patient's long term health benefit, including to reduce His cardiovascular risks. I answered all his questions today and the patient was in agreement. I plan to see him back after the sleep study is completed and encouraged him to call with any interim questions, concerns, problems or updates.   Thank you very much for allowing me  to participate in the care of this nice patient. If I can be of any further assistance to you please do not hesitate to talk to me.  Sincerely,   Brian Foley, MD, PhD

## 2019-07-16 ENCOUNTER — Encounter: Payer: Self-pay | Admitting: Adult Health

## 2019-07-17 ENCOUNTER — Telehealth: Payer: Self-pay | Admitting: Internal Medicine

## 2019-07-17 ENCOUNTER — Ambulatory Visit (INDEPENDENT_AMBULATORY_CARE_PROVIDER_SITE_OTHER): Payer: BC Managed Care – PPO | Admitting: *Deleted

## 2019-07-17 DIAGNOSIS — I639 Cerebral infarction, unspecified: Secondary | ICD-10-CM | POA: Diagnosis not present

## 2019-07-17 NOTE — Telephone Encounter (Addendum)
Wife(Katie Amedeo Plenty)  calling to confirm that patient may have massage after TIA. Confirmed that it is OK. Informed it is fine to have massage with loop recorder in place.

## 2019-07-17 NOTE — Telephone Encounter (Signed)
New Message:    Pt had a TIA on 06-04-19. She wants to know if it is alright for pt to have a massage?

## 2019-07-18 LAB — CUP PACEART REMOTE DEVICE CHECK
Date Time Interrogation Session: 20201104071255
Implantable Pulse Generator Implant Date: 20200923

## 2019-07-18 NOTE — Telephone Encounter (Signed)
Updated med list

## 2019-08-06 NOTE — Progress Notes (Signed)
[facility] Summary Report / Loop Recorder 

## 2019-08-07 ENCOUNTER — Ambulatory Visit: Payer: BC Managed Care – PPO

## 2019-08-11 ENCOUNTER — Encounter: Payer: Self-pay | Admitting: Neurology

## 2019-08-15 ENCOUNTER — Telehealth: Payer: Self-pay

## 2019-08-15 ENCOUNTER — Encounter: Payer: Self-pay | Admitting: Neurology

## 2019-08-15 ENCOUNTER — Telehealth: Payer: Self-pay | Admitting: Neurology

## 2019-08-15 ENCOUNTER — Telehealth: Payer: Self-pay | Admitting: Internal Medicine

## 2019-08-15 NOTE — Telephone Encounter (Signed)
Noted, thank you

## 2019-08-15 NOTE — Telephone Encounter (Signed)
I have called patient now multiple times to discuss the appt that had to be cancelledon 11/24. Pt has yet to return my call. I have left yet another message today to discuss the sleep study, why it was cancelled and why he didn't have the HST. Waiting for patient to return my call.

## 2019-08-15 NOTE — Telephone Encounter (Signed)
I told the pt girlfriend to send a manual transmission on Friday and [facility] will generate a summary report from that transmission. The appointment can stay the same.

## 2019-08-15 NOTE — Telephone Encounter (Signed)
LMOVM for pt girlfriend to give me a call back about the pt appointment.

## 2019-08-15 NOTE — Telephone Encounter (Signed)
Patient's girlfriend calling to reschedule his Home Remote Pacer Check appt on 08/20/19. Patient will be out of town

## 2019-08-15 NOTE — Telephone Encounter (Signed)
Meagan or Sheena, would you look into his request for HST? I would favor an in lab, but if he prefers the HST, we can do it.  Please review my chart message for reference.  Thank you.

## 2019-08-15 NOTE — Telephone Encounter (Signed)
Pt has been rescheduled for an in lab sleep study for tomorrow night.

## 2019-08-16 ENCOUNTER — Ambulatory Visit (INDEPENDENT_AMBULATORY_CARE_PROVIDER_SITE_OTHER): Payer: BC Managed Care – PPO | Admitting: Neurology

## 2019-08-16 ENCOUNTER — Other Ambulatory Visit: Payer: Self-pay

## 2019-08-16 DIAGNOSIS — G4720 Circadian rhythm sleep disorder, unspecified type: Secondary | ICD-10-CM

## 2019-08-16 DIAGNOSIS — G459 Transient cerebral ischemic attack, unspecified: Secondary | ICD-10-CM

## 2019-08-16 DIAGNOSIS — Z789 Other specified health status: Secondary | ICD-10-CM

## 2019-08-16 DIAGNOSIS — G4733 Obstructive sleep apnea (adult) (pediatric): Secondary | ICD-10-CM | POA: Diagnosis not present

## 2019-08-16 DIAGNOSIS — Z820 Family history of epilepsy and other diseases of the nervous system: Secondary | ICD-10-CM

## 2019-08-20 ENCOUNTER — Ambulatory Visit (INDEPENDENT_AMBULATORY_CARE_PROVIDER_SITE_OTHER): Payer: BC Managed Care – PPO | Admitting: *Deleted

## 2019-08-20 ENCOUNTER — Telehealth: Payer: Self-pay | Admitting: Internal Medicine

## 2019-08-20 DIAGNOSIS — R2990 Unspecified symptoms and signs involving the nervous system: Secondary | ICD-10-CM

## 2019-08-20 LAB — CUP PACEART REMOTE DEVICE CHECK
Date Time Interrogation Session: 20201207144246
Implantable Pulse Generator Implant Date: 20200923

## 2019-08-20 NOTE — Telephone Encounter (Signed)
New Message    Pt is calling and is wondering how he is suppose to send his transmission     Please call

## 2019-08-21 NOTE — Telephone Encounter (Signed)
LMOVM letting the pt know he do not need to do anything to send a transmission. His monitor is programmed to send automatically. If he has any further questions he can call my direct office number [phone].

## 2019-08-26 ENCOUNTER — Encounter: Payer: Self-pay | Admitting: Neurology

## 2019-09-04 ENCOUNTER — Other Ambulatory Visit: Payer: Self-pay

## 2019-09-04 NOTE — Patient Outreach (Signed)
Telephone outreach to patient to obtain mRS was successfully completed. MRS=0   Brian Randall Care Management Assistant  

## 2019-09-10 ENCOUNTER — Telehealth: Payer: Self-pay | Admitting: Neurology

## 2019-09-10 NOTE — Procedures (Signed)
PATIENT'S NAME:  Brian Randall, Brian Randall DOB:      11-07-1960      MR#:    [id number]     DATE OF RECORDING: 08/16/2019 REFERRING M.D.:  Ihor Austin, NP Study Performed:   Baseline Polysomnogram HISTORY: 58 year old man with a history of hypertension, hypothyroidism, history of TIA in September 2020, history of dysphagia secondary to esophageal polyp, BPH, and morbid obesity with a BMI of over 40, who was previously diagnosed with obstructive sleep apnea and has been on BiPAP therapy. He presents for re-evaluation and optimization of treatment. The patient endorsed the Epworth Sleepiness Scale at 7 points. The patient's weight 303 pounds with a height of 72 (inches), resulting in a BMI of 41.4 kg/m2. The patient's neck circumference measured 19.5 inches.  CURRENT MEDICATIONS: Aspirin, Lipitor, Zyrtec, Lexapro, Flonase, Glucophage, Prilosec, Thyroid Tabs   PROCEDURE:  This is a multichannel digital polysomnogram utilizing the Somnostar 11.2 system.  Electrodes and sensors were applied and monitored per AASM Specifications.   EEG, EOG, Chin and Limb EMG, were sampled at 200 Hz.  ECG, Snore and Nasal Pressure, Thermal Airflow, Respiratory Effort, CPAP Flow and Pressure, Oximetry was sampled at 50 Hz. Digital video and audio were recorded.      BASELINE STUDY  Lights Out was at 22:00 and Lights On at 04:58.  Total recording time (TRT) was 419 minutes, with a total sleep time (TST) of 381.5 minutes.   The patient's sleep latency was 8 minutes.  REM latency was 241 minutes, which .  The sleep efficiency was 91.1 %.     SLEEP ARCHITECTURE: WASO (Wake after sleep onset) was 32.5 minutes with mild to moderate sleep fragmentation noted. There were 35.5 minutes in Stage N1, 319 minutes Stage N2, 0 minutes Stage N3 and 27 minutes in Stage REM.  The percentage of Stage N1 was 9.3%, which is increased, Stage N2 was 83.6%, which is markedly increased, Stage N3 was absent and Stage R (REM sleep) was 7.1%, which is  reduced. The arousals were noted as: 46 were spontaneous, 1 were associated with PLMs, 365 were associated with respiratory events.  RESPIRATORY ANALYSIS:  There were a total of 541 respiratory events:  528 obstructive apneas, 0 central apneas and 0 mixed apneas with a total of 528 apneas and an apnea index (AI) of 83. /hour. There were 13 hypopneas with a hypopnea index of 2. /hour. The patient also had 0 respiratory event related arousals (RERAs).      The total APNEA/HYPOPNEA INDEX (AHI) was 85.1/hour and the total RESPIRATORY DISTURBANCE INDEX was  85.1 /hour.  42 events occurred in REM sleep and 26 events in NREM. The REM AHI was  93.3 /hour, versus a non-REM AHI of 84.5. The patient spent 278 minutes of total sleep time in the supine position and 104 minutes in non-supine.. The supine AHI was 85.7 versus a non-supine AHI of 83.5.  OXYGEN SATURATION & C02:  The Wake baseline 02 saturation was 91%, with the lowest being 72%. Time spent below 89% saturation equaled 318 minutes.  PERIODIC LIMB MOVEMENTS: The patient had a total of 7 Periodic Limb Movements.  The Periodic Limb Movement (PLM) index was 1.1 and the PLM Arousal index was .2/hour.  Audio and video analysis did not show any abnormal or unusual movements, behaviors, phonations or vocalizations. The patient took no bathroom breaks. Snoring was noted, ranging from mild to loud. The EKG was in keeping with normal sinus rhythm (NSR).  Post-study, the patient indicated that  sleep was the same as usual.   IMPRESSION:  1. Severe Obstructive Sleep Apnea (OSA) 2. Dysfunctions associated with sleep stages or arousal from sleep  RECOMMENDATIONS:  1. This study demonstrates (confirms) severe obstructive sleep apnea, with a total AHI of 85.1/hour, REM AHI of 93.3/hour, supine AHI of 85.7/hour and O2 nadir of 72%. Treatment with positive airway pressure in the form of CPAP or BiPAP is recommended. This will require a full night titration study to  optimize therapy. Other treatment options may include avoidance of supine sleep position along with weight loss, upper airway or jaw surgery in selected patients or the use of an oral appliance in certain patients. ENT evaluation and/or consultation with a maxillofacial surgeon or dentist may be feasible in some instances.    2. Please note that untreated obstructive sleep apnea carries additional perioperative morbidity. Patients with significant obstructive sleep apnea should receive perioperative PAP therapy and the surgeons and particularly the anesthesiologist should be informed of the diagnosis and the severity of the sleep disordered breathing. 3. This study shows sleep fragmentation and abnormal sleep stage percentages; these are nonspecific findings and per se do not signify an intrinsic sleep disorder or a cause for the patient's sleep-related symptoms. Causes include (but are not limited to) the first night effect of the sleep study, circadian rhythm disturbances, medication effect or an underlying mood disorder or medical problem.  4. The patient should be cautioned not to drive, work at heights, or operate dangerous or heavy equipment when tired or sleepy. Review and reiteration of good sleep hygiene measures should be pursued with any patient. 5. The patient will be seen in follow-up in the sleep clinic at [facility] for discussion of the test results, symptom and treatment compliance review, further management strategies, etc. The referring provider will be notified of the test results.  I certify that I have reviewed the entire raw data recording prior to the issuance of this report in accordance with the Standards of Accreditation of the American Academy of Sleep Medicine (AASM)  Star Age, MD, PhD Diplomat, American Board of Neurology and Sleep Medicine (Neurology and Sleep Medicine)

## 2019-09-10 NOTE — Telephone Encounter (Signed)
-----   Message from Star Age, MD sent at 09/10/2019  9:55 AM EST ----- Patient referred by Frann Rider, NP, seen by me on 07/11/19, diagnostic PSG on 08/16/19.   Please call and notify the patient that the recent sleep study showed and confirmed severe obstructive sleep apnea. I recommend treatment for this in the form of CPAP or BiPAP. This will require a repeat sleep study for proper titration and mask fitting and correct monitoring of the oxygen saturations. Please explain to patient. I have placed an order in the chart. Please advise him to continue using his autoBiPAP for now.   Star Age, MD, PhD [facility])

## 2019-09-10 NOTE — Progress Notes (Signed)
Patient referred by Frann Rider, NP, seen by me on 07/11/19, diagnostic PSG on 08/16/19.   Please call and notify the patient that the recent sleep study showed and confirmed severe obstructive sleep apnea. I recommend treatment for this in the form of CPAP or BiPAP. This will require a repeat sleep study for proper titration and mask fitting and correct monitoring of the oxygen saturations. Please explain to patient. I have placed an order in the chart. Please advise him to continue using his autoBiPAP for now.   Star Age, MD, PhD [facility])

## 2019-09-10 NOTE — Addendum Note (Signed)
Addended by: Star Age on: 09/10/2019 09:55 AM   Modules accepted: Orders

## 2019-09-10 NOTE — Telephone Encounter (Signed)
I called pt. I advised pt that Dr. Athar reviewed their sleep study results and found that has severe sleep apnea and recommends that pt be treated with a cpap. Dr. Athar recommends that pt return for a repeat sleep study in order to properly titrate the cpap and ensure a good mask fit. Pt is agreeable to returning for a titration study. I advised pt that our sleep lab will file with pt's insurance and call pt to schedule the sleep study when we hear back from the pt's insurance regarding coverage of this sleep study. Pt verbalized understanding of results. Pt had no questions at this time but was encouraged to call back if questions arise.   

## 2019-09-11 ENCOUNTER — Telehealth: Payer: Self-pay

## 2019-09-11 DIAGNOSIS — G4733 Obstructive sleep apnea (adult) (pediatric): Secondary | ICD-10-CM

## 2019-09-11 NOTE — Telephone Encounter (Signed)
Submitted Cpap titration auth to [facility] and they denied study. I attempted three times to get this approved and still denied. Not sure why they approved the PSG and not this one. He will need an auto titration.If patient has trouble with mask, I will be happy to work with him. He will need an auto titration ordered.

## 2019-09-12 NOTE — Telephone Encounter (Signed)
Called the patient and reviewed that insurance wouldn't cover the titration study. Pt is agreeable to starting a CPAP. I advised pt that an order will be sent to a DME, [facility], and [facility] will call the pt within about one week after they file with the pt's insurance. [facility] will show the pt how to use the machine, fit for masks, and troubleshoot the CPAP if needed. A follow up appt was made for insurance purposes with Dr Rexene Alberts on 11/19/19 at 8:30 am. Pt verbalized understanding to arrive 15 minutes early and bring their CPAP. A letter with all of this information in it will be mailed to the pt as a reminder. I verified with the pt that the address we have on file is correct. Pt verbalized understanding of results. Pt had no questions at this time but was encouraged to call back if questions arise. I have sent the order to [facility] and have received confirmation that they have received the order.

## 2019-09-12 NOTE — Telephone Encounter (Signed)
We will set patient up with autoPAP at home, as insurance denied in house titration study for his severe OSA. Pls process order and notify patient and set up FU in 10 weeks with me or NP.

## 2019-09-12 NOTE — Telephone Encounter (Signed)
Called the patient to make him aware that the insurance is not covering the titration study. They are requesting patient try auto CPAP first. There was no answer. LVM for the patient to call back.

## 2019-09-12 NOTE — Telephone Encounter (Signed)
Patient returned miss call. Please follow up

## 2019-09-16 NOTE — Progress Notes (Signed)
ILR remote 

## 2019-09-20 ENCOUNTER — Encounter: Payer: Self-pay | Admitting: Neurology

## 2019-09-21 ENCOUNTER — Telehealth: Payer: Self-pay

## 2019-09-21 ENCOUNTER — Ambulatory Visit (INDEPENDENT_AMBULATORY_CARE_PROVIDER_SITE_OTHER): Payer: BC Managed Care – PPO | Admitting: *Deleted

## 2019-09-21 DIAGNOSIS — R2990 Unspecified symptoms and signs involving the nervous system: Secondary | ICD-10-CM | POA: Diagnosis not present

## 2019-09-21 NOTE — Telephone Encounter (Signed)
Spoke with patient to remind of missed remote transmission 

## 2019-09-24 LAB — CUP PACEART REMOTE DEVICE CHECK
Date Time Interrogation Session: 20210108211657
Implantable Pulse Generator Implant Date: 20200923

## 2019-10-10 ENCOUNTER — Telehealth: Payer: Self-pay | Admitting: Adult Health

## 2019-10-10 NOTE — Telephone Encounter (Signed)
I called patient and LVM requesting he call back to reschedule 2/23 appointment, due to [place] out of office.

## 2019-10-22 ENCOUNTER — Encounter: Payer: Self-pay | Admitting: Adult Health

## 2019-10-22 ENCOUNTER — Ambulatory Visit (INDEPENDENT_AMBULATORY_CARE_PROVIDER_SITE_OTHER): Payer: BC Managed Care – PPO | Admitting: *Deleted

## 2019-10-22 DIAGNOSIS — R2990 Unspecified symptoms and signs involving the nervous system: Secondary | ICD-10-CM | POA: Diagnosis not present

## 2019-10-22 LAB — CUP PACEART REMOTE DEVICE CHECK
Date Time Interrogation Session: 20210207230555
Implantable Pulse Generator Implant Date: 20200923

## 2019-10-22 NOTE — Telephone Encounter (Signed)
I called patient and LVM to reschedule appointment. I have sent patient a letter to advise.

## 2019-10-23 NOTE — Progress Notes (Signed)
ILR Remote 

## 2019-10-27 ENCOUNTER — Encounter: Payer: Self-pay | Admitting: Neurology

## 2019-11-06 ENCOUNTER — Ambulatory Visit: Payer: BC Managed Care – PPO | Admitting: Adult Health

## 2019-11-18 ENCOUNTER — Encounter: Payer: Self-pay | Admitting: Neurology

## 2019-11-19 ENCOUNTER — Ambulatory Visit: Payer: BC Managed Care – PPO | Admitting: Neurology

## 2019-11-19 ENCOUNTER — Encounter: Payer: Self-pay | Admitting: Neurology

## 2019-11-19 ENCOUNTER — Other Ambulatory Visit: Payer: Self-pay

## 2019-11-19 VITALS — BP 132/89 | HR 78 | Temp 96.9°F | Ht 71.0 in | Wt 304.0 lb

## 2019-11-19 DIAGNOSIS — G4733 Obstructive sleep apnea (adult) (pediatric): Secondary | ICD-10-CM | POA: Diagnosis not present

## 2019-11-19 DIAGNOSIS — Z9989 Dependence on other enabling machines and devices: Secondary | ICD-10-CM | POA: Diagnosis not present

## 2019-11-19 NOTE — Progress Notes (Signed)
Subjective:    Patient ID: Brian Randall is a 59 y.o. male.  HPI     Interim history:   Brian Randall is a 59 year old right-handed gentleman with an underlying medical history of hypertension, hypothyroidism, history of TIA in September 2020, history of dysphagia secondary to esophageal polyp, BPH, and morbid obesity with a BMI of over 40, who presents for follow-up consultation of his obstructive sleep apnea after interim retesting.  The patient is unaccompanied today.  I first met him at the request of Frann Rider, NP on 07/11/2019, at which time he reported a prior diagnosis of obstructive sleep apnea.  He had difficulty with his BiPAP.  Sleep study testing was years ago.  He was advised to come back for sleep study.  He had a baseline sleep study on 08/16/2019 which confirmed severe obstructive sleep apnea, AHI was 85.1/h, he had a reduced percentage of REM sleep at 7.1, markedly increased percentage of light state sleep including N1 and N2.  His O2 nadir was 72%.  His insurance denied another sleep study and he was advised to start AutoPap therapy. Today, 11/19/2019: I reviewed his AutoPap compliance data from 10/20/2019 through 11/18/2019, which is a total of 30 days, during which time he used his machine 21 days, with percent use days greater than 4 hours at 60%, indicating suboptimal compliance with an average usage of 5 hours and 57 minutes for days on treatment, residual AHI at goal at 2/h, leak high consistently with a 95th percentile at 63.7 L/min, average pressure for the 95th percentile at 12.2 L/min, range of 8 cm to 16 cm with EPR.  He reports that he may need to change his mask, he did get new supplies.  He also reports some discomfort from the mask and headgear but tolerates the pressure.  On his previous machine he did not like the heated humidity so currently he is not using any humidity at all.  He does admit to waking up in the middle of the night with severe dry mouth.  He would be  willing to trying the humidity without extra heat.  He is also open to trying a different style of full facemask.  He keeps his facial hair trimmed.  He is motivated to continue with treatment, neurologically, he feels stable.  He does have issues with arthritis including neck pain and bilateral knee pain.  The patient's allergies, current medications, family history, past medical history, past social history, past surgical history and problem list were reviewed and updated as appropriate.   Previously:   07/11/2019: (he) was previously diagnosed with obstructive sleep apnea and has been on BiPAP therapy.  Prior sleep study results are not available for my review today.  I reviewed your office note from 07/04/2019.  I reviewed his auto BiPAP compliance data from the past 90 days, from 04/10/2019 through 07/08/2019, during which time he used his machine 39 out of 90 days with percent use days greater than 4 hours at 26% only, indicating low compliance with an average usage for days on treatment of 4 hours and 53 minutes, residual AHI mildly elevated at 10.3/h, leak quite high with a 95th percentile at 64.2 L/min on a maximum IPAP of 17, minimum EPAP of 4, pressure support of 4.  His Epworth sleepiness score is 7 out of 24, fatigue severity score is 23 out of 63.  He recalls being told that he had a breathing event 30 or 40 times per hour.  He finds  the pressure uncomfortable, wonders if the settings are right.  He becomes claustrophobic and sometimes has to pull off the mask because he feels hot and enclosed.  He uses a full facemask.  He is a restless sleeper.  He has 2 residences, he spends the workweek in [place] at his house and the weekend at his house in [place] where his wife also has a business.  He has to pack up the machine twice a week which is bothersome to him.  His brother has sleep apnea and uses a CPAP machine.  Patient's weight has been fluctuating.  He feels largely back to baseline.  His  bedtime is between 1030 and 11 and rise time around 530.  At his home in [place] they have 2 dogs.  He has 2 grown children, his son lives with him in [place] as he also works in the business with him.  The patient is a non-smoker and drinks alcohol occasionally in the form of beer maybe once a week and caffeine occasionally in the form of tea.  He would be willing to get retested with a sleep study and consider CPAP therapy.  He would like to find something that he can tolerate better and find a mask that he can keep on without feeling panicky.  He does not have night to night nocturia.  His sleep study was either in 2015 or 16 by his estimation, in [place], [place].   His Past Medical History Is Significant For: Past Medical History:  Diagnosis Date  . Kidney stones    never been diagnosed but pt states he passed one  . Spider bite wound    left wrist  . Umbilical hernia     His Past Surgical History Is Significant For: Past Surgical History:  Procedure Laterality Date  . LOOP RECORDER INSERTION N/A 06/06/2019   Procedure: LOOP RECORDER INSERTION;  Surgeon: Evans Lance, MD;  Location: [facility];  Service: Cardiovascular;  Laterality: N/A;  . UMBILICAL HERNIA REPAIR      His Family History Is Significant For: Family History  Problem Relation Age of Onset  . Dementia Mother   . Diabetes Brother   . Breast cancer Mother   . Cancer Maternal Aunt        type unknown  . Cancer Maternal Grandmother        type unknown    His Social History Is Significant For: Social History   Socioeconomic History  . Marital status: Single    Spouse name: Not on file  . Number of children: 2  . Years of education: Not on file  . Highest education level: Not on file  Occupational History  . Occupation: landscaper  Tobacco Use  . Smoking status: Never Smoker  . Smokeless tobacco: Never Used  Substance and Sexual Activity  . Alcohol use: Yes    Alcohol/week: 0.0  standard drinks    Comment: occasional beer or wine but rare  . Drug use: No  . Sexual activity: Not on file  Other Topics Concern  . Not on file  Social History Narrative   ** Merged History Encounter **       Patient reports that he is divorced. He has a girlfriend in the [place] area. 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 Difficulty of Paying Living Expenses: Not on  file  Food Insecurity:   . Worried About Charity fundraiser in the Last Year: Not on file  . Ran Out of Food in the Last Year: Not on file  Transportation Needs:   . Lack of Transportation (Medical): Not on file  . Lack of Transportation (Non-Medical): Not on file  Physical Activity:   . Days of Exercise per Week: Not on file  . Minutes of Exercise per Session: Not on file  Stress:   . Feeling of Stress : Not on file  Social Connections:   . Frequency of Communication with Friends and Family: Not on file  . Frequency of Social Gatherings with Friends and Family: Not on file  . Attends Religious Services: Not on file  . Active Member of Clubs or Organizations: Not on file  . Attends Archivist Meetings: Not on file  . Marital Status: Not on file    His Allergies Are:  Allergies  Allergen Reactions  . Codeine Nausea And Vomiting  . Codeine Nausea Only  :   His Current Medications Are:  Outpatient Encounter Medications as of 11/19/2019  Medication Sig  . aspirin 81 MG EC tablet Take 1 tablet (81 mg total) by mouth daily.  Marland Kitchen atorvastatin (LIPITOR) 40 MG tablet Take 1 tablet (40 mg total) by mouth daily at 6 PM.  . cetirizine (ZYRTEC) 10 MG tablet Take 10 mg by mouth daily.  Marland Kitchen escitalopram (LEXAPRO) 10 MG tablet Take 1 tablet (10 mg total) by mouth at bedtime.  . fluticasone (FLONASE) 50 MCG/ACT nasal spray Place 1 spray into both nostrils daily.  . metFORMIN  (GLUCOPHAGE) 500 MG tablet Take by mouth 2 (two) times daily with a meal.  . omeprazole (PRILOSEC) 40 MG capsule Take 40 mg by mouth 2 (two) times daily.  . Thyroid 97.5 MG TABS Take 97.5 mg by mouth daily.   No facility-administered encounter medications on file as of 11/19/2019.  :  Review of Systems:  Out of a complete 14 point review of systems, all are reviewed and negative with the exception of these symptoms as listed below:  Review of Systems  Neurological:       Initial f/u for pap machine. Pt reports difficulty getting used to the machine and has not been using the machine nightly.     Objective:  Neurological Exam  Physical Exam Physical Examination:   Vitals:   11/19/19 0849  BP: 132/89  Pulse: 78  Temp: (!) 96.9 F (36.1 C)    General Examination: The patient is a very pleasant 60 y.o. male in no acute distress. He appears well-developed and well-nourished and well groomed.   HEENT: Normocephalic, atraumatic, pupils are equal, round and reactive to light, extraocular tracking is well preserved, hearing is grossly intact, no nystagmus is seen.  Face is symmetric with normal facial animation, speech is clear without hypophonia, voice tremor or dysarthria, airway examination reveals Mild mouth dryness, moderate airway cramping, tongue protrudes centrally and palate elevates symmetrically, no carotid bruits.  Chest: Clear to auscultation without wheezing, rhonchi or crackles noted.  Heart: S1+S2+0, regular and normal without murmurs, rubs or gallops noted.   Abdomen: Soft, non-tender and non-distended with normal bowel sounds appreciated on auscultation.  Extremities: There is trace edema in the distal lower extremities bilaterally.  Skin: Warm and dry without trophic changes noted.  Musculoskeletal: exam reveals Bilateral knee discomfort, left more than right.  He also reports left shoulder pain.   Neurologically:  Mental status: The  patient is awake,  alert and oriented in all 4 spheres. His immediate and remote memory, attention, language skills and fund of knowledge are appropriate. There is no evidence of aphasia, agnosia, apraxia or anomia. Speech is clear with normal prosody and enunciation. Thought process is linear. Mood is normal and affect is normal.  Cranial nerves II - XII are as described above under HEENT exam.  Motor exam: Normal bulk, strength and tone is noted. There is no drift, tremor or rebound. Fine motor skills and coordination: grossly intact.  Cerebellar testing: No dysmetria or intention tremor. There is no truncal or gait ataxia.  Sensory exam: intact to light touch in the upper and lower extremities.  Gait, station and balance: He stands easily. No veering to one side is noted. No leaning to one side is noted. Posture is age-appropriate and stance is narrow based. Gait shows L limp.   Assessment and Plan:  In summary, Perri Lamagna is a very pleasant 59 year old male with an underlying medical history of hypertension, hypothyroidism, history of TIA in September 2020, history of dysphagia secondary to esophageal polyp, BPH, and morbid obesity with a BMI of over 40, who presents for Follow-up consultation of his obstructive sleep apnea.  He was originally diagnosed with OSA several years ago.  He had an interim baseline sleep study in December 2020 which confirmed severe obstructive sleep apnea.  He has since then establish treatment on AutoPap.  He is currently slightly suboptimal with his compliance primarily because of skipping nights.  He endorses having to travel back and forth to locations typically.  He is motivated to continue with treatment and work on his compliance. We mutually agreed to keep his settings the same.  He is advised to try a different style of the a full facemask and we will have him talk to our sleep lab manager regarding options.  He is advised to follow-up routinely to see one of our nurse  practitioners in 6 months, sooner if needed.  We talked about his sleep study results and his compliance data today.  I answered all his questions today and he was in agreement with the plan.   I spent 30 minutes in total face-to-face time and in reviewing records during pre-charting, more than 50% of which was spent in counseling and coordination of care, reviewing test results, reviewing medications and treatment regimen and/or in discussing or reviewing the diagnosis of OSA, the prognosis and treatment options. Pertinent laboratory and imaging test results that were available during this visit with the patient were reviewed by me and considered in my medical decision making (see chart for details).

## 2019-11-19 NOTE — Patient Instructions (Addendum)
Please continue using your autoPAP regularly. While your insurance requires that you use PAP at least 4 hours each night on 70% of the nights, I recommend, that you not skip any nights and use it throughout the night if you can. Getting used to PAP and staying with the treatment long term does take time and patience and discipline. Untreated obstructive sleep apnea when it is moderate to severe can have an adverse impact on cardiovascular health and raise her risk for heart disease, arrhythmias, hypertension, congestive heart failure, stroke and diabetes. Untreated obstructive sleep apnea causes sleep disruption, nonrestorative sleep, and sleep deprivation. This can have an impact on your day to day functioning and cause daytime sleepiness and impairment of cognitive function, memory loss, mood disturbance, and problems focussing. Using PAP regularly can improve these symptoms.  Please try not to skip any days. When you use your AutoPap, your severe sleep apnea is actually well treated.  Please stop by the sleep lab on your way out today to see if we can give you a different style of a full facemask to try at home.  Your leak is very high, please also start using the humidifier in the machine with distilled water only.  Your DME company can talk you through the process of using humidity without additional heat.

## 2019-11-22 ENCOUNTER — Ambulatory Visit (INDEPENDENT_AMBULATORY_CARE_PROVIDER_SITE_OTHER): Payer: BC Managed Care – PPO | Admitting: *Deleted

## 2019-11-22 DIAGNOSIS — R2990 Unspecified symptoms and signs involving the nervous system: Secondary | ICD-10-CM

## 2019-11-22 LAB — CUP PACEART REMOTE DEVICE CHECK
Date Time Interrogation Session: 20210310230600
Implantable Pulse Generator Implant Date: 20200923

## 2019-11-22 NOTE — Progress Notes (Signed)
ILR Remote 

## 2019-12-24 ENCOUNTER — Ambulatory Visit (INDEPENDENT_AMBULATORY_CARE_PROVIDER_SITE_OTHER): Payer: BC Managed Care – PPO | Admitting: *Deleted

## 2019-12-24 DIAGNOSIS — R2990 Unspecified symptoms and signs involving the nervous system: Secondary | ICD-10-CM

## 2019-12-26 LAB — CUP PACEART REMOTE DEVICE CHECK
Date Time Interrogation Session: 20210410230311
Implantable Pulse Generator Implant Date: 20200923

## 2019-12-26 NOTE — Progress Notes (Signed)
ILR Remote 

## 2020-01-24 LAB — CUP PACEART REMOTE DEVICE CHECK
Date Time Interrogation Session: 20210511230200
Implantable Pulse Generator Implant Date: 20200923

## 2020-01-28 ENCOUNTER — Ambulatory Visit (INDEPENDENT_AMBULATORY_CARE_PROVIDER_SITE_OTHER): Payer: BC Managed Care – PPO | Admitting: *Deleted

## 2020-01-28 DIAGNOSIS — I639 Cerebral infarction, unspecified: Secondary | ICD-10-CM | POA: Diagnosis not present

## 2020-01-29 NOTE — Progress Notes (Signed)
[facility] Summary Report / Loop Recorder 

## 2020-03-03 ENCOUNTER — Ambulatory Visit (INDEPENDENT_AMBULATORY_CARE_PROVIDER_SITE_OTHER): Payer: BC Managed Care – PPO | Admitting: *Deleted

## 2020-03-03 DIAGNOSIS — I639 Cerebral infarction, unspecified: Secondary | ICD-10-CM

## 2020-03-03 LAB — CUP PACEART REMOTE DEVICE CHECK
Date Time Interrogation Session: 20210620230450
Implantable Pulse Generator Implant Date: 20200923

## 2020-03-04 NOTE — Progress Notes (Signed)
[facility] Summary Report / Loop Recorder 

## 2020-04-07 ENCOUNTER — Ambulatory Visit (INDEPENDENT_AMBULATORY_CARE_PROVIDER_SITE_OTHER): Payer: BC Managed Care – PPO | Admitting: *Deleted

## 2020-04-07 DIAGNOSIS — I639 Cerebral infarction, unspecified: Secondary | ICD-10-CM

## 2020-04-08 LAB — CUP PACEART REMOTE DEVICE CHECK
Date Time Interrogation Session: 20210725230526
Implantable Pulse Generator Implant Date: 20200923

## 2020-04-09 NOTE — Progress Notes (Signed)
[facility] Summary Report / Loop Recorder 

## 2020-05-12 ENCOUNTER — Ambulatory Visit (INDEPENDENT_AMBULATORY_CARE_PROVIDER_SITE_OTHER): Payer: BC Managed Care – PPO | Admitting: *Deleted

## 2020-05-12 DIAGNOSIS — I639 Cerebral infarction, unspecified: Secondary | ICD-10-CM

## 2020-05-12 LAB — CUP PACEART REMOTE DEVICE CHECK
Date Time Interrogation Session: 20210827230536
Implantable Pulse Generator Implant Date: 20200923

## 2020-05-14 NOTE — Progress Notes (Signed)
[facility] Summary Report / Loop Recorder 

## 2020-05-27 ENCOUNTER — Encounter: Payer: Self-pay | Admitting: Adult Health

## 2020-05-27 ENCOUNTER — Other Ambulatory Visit: Payer: Self-pay

## 2020-05-27 ENCOUNTER — Ambulatory Visit: Payer: BC Managed Care – PPO | Admitting: Adult Health

## 2020-05-27 VITALS — BP 128/76 | HR 62 | Ht 71.0 in | Wt 318.6 lb

## 2020-05-27 DIAGNOSIS — Z9989 Dependence on other enabling machines and devices: Secondary | ICD-10-CM

## 2020-05-27 DIAGNOSIS — G4733 Obstructive sleep apnea (adult) (pediatric): Secondary | ICD-10-CM | POA: Diagnosis not present

## 2020-05-27 NOTE — Patient Instructions (Signed)
Your Plan:  Restart CPAP Order sent to DME for mask refitting If your symptoms worsen or you develop new symptoms please let us know.     Thank you for coming to see [place] at [facility]. I hope we have been able to provide you high quality care today.  You may receive a patient satisfaction survey over the next few weeks. We would appreciate your feedback and comments so that we may continue to improve ourselves and the health of our patients.

## 2020-05-27 NOTE — Progress Notes (Addendum)
PATIENT: Brian Randall DOB: Jan 27, 1961  REASON FOR VISIT: follow up HISTORY FROM: patient  HISTORY OF PRESENT ILLNESS: Today 05/27/20:  Brian Randall is a 59 year old male with a history of obstructive sleep apnea on CPAP.  He returns today for follow-up.  He states that he has not been using the CPAP consistently.  Reports that he gets claustrophobic.  He states that the heated tubing feature was supposed to be turned off but he still feels that the area is hot.  He puts ice cubes in his water tank.  He currently has a fullface mask.  He returns today for evaluation.  HISTORY 11/19/2019: (copied from Brian Randall note)  I reviewed his AutoPap compliance data from 10/20/2019 through 11/18/2019, which is a total of 30 days, during which time he used his machine 21 days, with percent use days greater than 4 hours at 60%, indicating suboptimal compliance with an average usage of 5 hours and 57 minutes for days on treatment, residual AHI at goal at 2/h, leak high consistently with a 95th percentile at 63.7 L/min, average pressure for the 95th percentile at 12.2 L/min, range of 8 cm to 16 cm with EPR.  He reports that he may need to change his mask, he did get new supplies.  He also reports some discomfort from the mask and headgear but tolerates the pressure.  On his previous machine he did not like the heated humidity so currently he is not using any humidity at all.  He does admit to waking up in the middle of the night with severe dry mouth.  He would be willing to trying the humidity without extra heat.  He is also open to trying a different style of full facemask.  He keeps his facial hair trimmed.  He is motivated to continue with treatment, neurologically, he feels stable.  He does have issues with arthritis including neck pain and bilateral knee pain.   REVIEW OF SYSTEMS: Out of a complete 14 system review of symptoms, the patient complains only of the following symptoms, and all other reviewed  systems are negative.   ESS 6  ALLERGIES: Allergies  Allergen Reactions  . Codeine Nausea And Vomiting  . Codeine Nausea Only    HOME MEDICATIONS: Outpatient Medications Prior to Visit  Medication Sig Dispense Refill  . aspirin 81 MG EC tablet Take 1 tablet (81 mg total) by mouth daily. 30 tablet   . atorvastatin (LIPITOR) 40 MG tablet Take 1 tablet (40 mg total) by mouth daily at 6 PM. 30 tablet 2  . cetirizine (ZYRTEC) 10 MG tablet Take 10 mg by mouth daily.    Marland Kitchen escitalopram (LEXAPRO) 10 MG tablet Take 1 tablet (10 mg total) by mouth at bedtime. 30 tablet 3  . fluticasone (FLONASE) 50 MCG/ACT nasal spray Place 1 spray into both nostrils daily.    Marland Kitchen gabapentin (NEURONTIN) 100 MG capsule TAKE 1 TO 3 CAPSULES BY MOUTH NIGHTLY FOR NECK PAIN    . metFORMIN (GLUCOPHAGE) 500 MG tablet Take by mouth 2 (two) times daily with a meal.    . omeprazole (PRILOSEC) 40 MG capsule Take 40 mg by mouth 2 (two) times daily.    Marland Kitchen OZEMPIC, 1 MG/DOSE, 2 MG/1.5ML SOPN SMARTSIG:0.75 Milliliter(s) SUB-Q Once a Week    . tadalafil (CIALIS) 10 MG tablet Take 10 mg by mouth daily.    . Thyroid 97.5 MG TABS Take 97.5 mg by mouth daily.     No facility-administered medications prior  to visit.    PAST MEDICAL HISTORY: Past Medical History:  Diagnosis Date  . Kidney stones    never been diagnosed but pt states he passed one  . Spider bite wound    left wrist  . Umbilical hernia     PAST SURGICAL HISTORY: Past Surgical History:  Procedure Laterality Date  . LOOP RECORDER INSERTION N/A 06/06/2019   Procedure: LOOP RECORDER INSERTION;  Surgeon: Brian Maw, MD;  [facility];  Service: Cardiovascular;  Laterality: N/A;  . UMBILICAL HERNIA REPAIR      FAMILY HISTORY: Family History  Problem Relation Age of Onset  . Dementia Mother   . Diabetes Brother   . Breast cancer Mother   . Cancer Maternal Aunt        type unknown  . Cancer Maternal Grandmother        type unknown     SOCIAL HISTORY: Social History   Socioeconomic History  . Marital status: Single    Spouse name: Not on file  . Number of children: 2  . Years of education: Not on file  . Highest education level: Not on file  Occupational History  . Occupation: landscaper  Tobacco Use  . Smoking status: Never Smoker  . Smokeless tobacco: Never Used  Vaping Use  . Vaping Use: Never used  Substance and Sexual Activity  . Alcohol use: Yes    Alcohol/week: 0.0 standard drinks    Comment: occasional beer or wine but rare  . Drug use: No  . Sexual activity: Not on file  Other Topics Concern  . Not on file  Social History Narrative   ** Merged History Encounter **       Patient reports that he is divorced. He has a girlfriend in the [place] area. 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 Difficulty of Paying Living Expenses: Not on file  Food Insecurity:   . Worried About Programme researcher, broadcasting/film/video in the Last Year: Not on file  . Ran Out of Food in the Last Year: Not on file  Transportation Needs:   . Lack of Transportation (Medical): Not on file  . Lack of Transportation (Non-Medical): Not on file  Physical Activity:   . Days of Exercise per Week: Not on file  . Minutes of Exercise per Session: Not on file  Stress:   . Feeling of Stress : Not on file  Social Connections:   . Frequency of Communication with Friends and Family: Not on file  . Frequency of Social Gatherings with Friends and Family: Not on file  . Attends Religious Services: Not on file  . Active Member of Clubs or Organizations: Not on file  . Attends Banker Meetings: Not on file  . Marital Status: Not on file  Intimate Partner Violence:   . Fear of Current or Ex-Partner: Not on file  . Emotionally Abused: Not on file  . Physically Abused: Not on file  . Sexually Abused: Not on file       PHYSICAL EXAM  Vitals:   05/27/20 0758  BP: 128/76  Pulse: 62  Weight: (!) 318 lb 9.6 oz (144.5 kg)  Height: 5\' 11"  (1.803 m)   Body mass index is 44.44 kg/m.  Generalized: Well developed, in no acute distress  Chest: Lungs clear to auscultation bilaterally  Neurological examination  Mentation: Alert oriented to time, place, history taking. Follows all commands speech and language fluent Cranial nerve II-XII: Extraocular movements were full, visual field were full on confrontational test Head turning and shoulder shrug  were normal and symmetric. Motor: The motor testing reveals 5 over 5 strength of all 4 extremities. Good symmetric motor tone is noted throughout.  Sensory: Sensory testing is intact to soft touch on all 4 extremities. No evidence of extinction is noted.  Gait and station: Gait is normal.    DIAGNOSTIC DATA (LABS, IMAGING, TESTING) - I reviewed patient records, labs, notes, testing and imaging myself where available.  Lab Results  Component Value Date   WBC 8.4 06/06/2019   HGB 18.4 (H) 06/06/2019   HCT 58.0 (H) 06/06/2019   MCV 93.4 06/06/2019   PLT 155 06/06/2019      Component Value Date/Time   NA 136 06/06/2019 0421   K 4.4 06/06/2019 0421   CL 101 06/06/2019 0421   CO2 27 06/06/2019 0421   GLUCOSE 88 06/06/2019 0421   BUN 16 06/06/2019 0421   CREATININE 1.02 06/06/2019 0421   CALCIUM 8.7 (L) 06/06/2019 0421   PROT 7.2 06/04/2019 1153   ALBUMIN 3.8 06/04/2019 1153   AST 20 06/04/2019 1153   ALT 20 06/04/2019 1153   ALKPHOS 62 06/04/2019 1153   BILITOT 1.8 (H) 06/04/2019 1153   GFRNONAA >60 06/06/2019 0421   GFRAA >60 06/06/2019 0421   Lab Results  Component Value Date   CHOL 190 06/06/2019   HDL 35 (L) 06/06/2019   LDLCALC 134 (H) 06/06/2019   TRIG 104 06/06/2019   CHOLHDL 5.4 06/06/2019   Lab Results  Component Value Date   HGBA1C 5.3 06/05/2019   No results found for: [id number] Lab Results  Component Value Date   TSH  4.798 (H) 06/06/2019      ASSESSMENT AND PLAN 59 y.o. year old male  has a past medical history of Kidney stones, Spider bite wound, and Umbilical hernia. here with:  1. OSA on CPAP  -Order sent to DME company for mask refitting here for them to ensure that the heated tubing feature is turned off. - Encouraged patient to use CPAP nightly and > 4 hours each night - F/U in 6 months or sooner if needed   I spent 25 minutes of face-to-face and non-face-to-face time with patient.  This included previsit chart review, lab review, study review, order entry, electronic health record documentation, patient education.  Butch Penny, MSN, NP-C 05/27/2020, 8:15 AM [facility] [address], [address] [place], [place] [address] [phone]  I reviewed the above note and documentation by the Nurse Practitioner and agree with the history, exam, assessment and plan as outlined above. I was available for consultation. Huston Foley, MD, PhD [facility])

## 2020-07-17 LAB — CUP PACEART REMOTE DEVICE CHECK
Date Time Interrogation Session: 20211101230335
Implantable Pulse Generator Implant Date: 20200923

## 2020-07-21 ENCOUNTER — Ambulatory Visit (INDEPENDENT_AMBULATORY_CARE_PROVIDER_SITE_OTHER): Payer: BC Managed Care – PPO

## 2020-07-21 DIAGNOSIS — I639 Cerebral infarction, unspecified: Secondary | ICD-10-CM | POA: Diagnosis not present

## 2020-07-22 NOTE — Progress Notes (Signed)
[facility] Summary Report / Loop Recorder 

## 2020-08-25 ENCOUNTER — Ambulatory Visit (INDEPENDENT_AMBULATORY_CARE_PROVIDER_SITE_OTHER): Payer: BC Managed Care – PPO

## 2020-08-25 DIAGNOSIS — I639 Cerebral infarction, unspecified: Secondary | ICD-10-CM

## 2020-08-25 LAB — CUP PACEART REMOTE DEVICE CHECK
Date Time Interrogation Session: 20211204230243
Implantable Pulse Generator Implant Date: 20200923

## 2020-09-09 NOTE — Progress Notes (Signed)
[facility] Summary Report / Loop Recorder 

## 2020-09-29 ENCOUNTER — Ambulatory Visit (INDEPENDENT_AMBULATORY_CARE_PROVIDER_SITE_OTHER): Payer: BC Managed Care – PPO

## 2020-09-29 DIAGNOSIS — I639 Cerebral infarction, unspecified: Secondary | ICD-10-CM | POA: Diagnosis not present

## 2020-10-01 LAB — CUP PACEART REMOTE DEVICE CHECK
Date Time Interrogation Session: 20220115230408
Implantable Pulse Generator Implant Date: 20200923

## 2020-10-13 NOTE — Progress Notes (Signed)
[facility] Summary Report / Loop Recorder 

## 2020-11-03 ENCOUNTER — Ambulatory Visit (INDEPENDENT_AMBULATORY_CARE_PROVIDER_SITE_OTHER): Payer: BC Managed Care – PPO

## 2020-11-03 DIAGNOSIS — I639 Cerebral infarction, unspecified: Secondary | ICD-10-CM

## 2020-11-03 LAB — CUP PACEART REMOTE DEVICE CHECK
Date Time Interrogation Session: 20220217230706
Implantable Pulse Generator Implant Date: 20200923

## 2020-11-10 NOTE — Progress Notes (Signed)
[facility] Summary Report / Loop Recorder 

## 2020-11-25 ENCOUNTER — Ambulatory Visit: Payer: BC Managed Care – PPO | Admitting: Adult Health

## 2020-12-06 LAB — CUP PACEART REMOTE DEVICE CHECK
Date Time Interrogation Session: 20220322230252
Implantable Pulse Generator Implant Date: 20200923

## 2020-12-08 ENCOUNTER — Ambulatory Visit (INDEPENDENT_AMBULATORY_CARE_PROVIDER_SITE_OTHER): Payer: BC Managed Care – PPO

## 2020-12-08 DIAGNOSIS — I639 Cerebral infarction, unspecified: Secondary | ICD-10-CM

## 2020-12-19 NOTE — Progress Notes (Signed)
[facility] Summary Report / Loop Recorder 

## 2021-01-15 ENCOUNTER — Encounter: Payer: BC Managed Care – PPO | Admitting: Adult Health

## 2021-01-19 NOTE — Progress Notes (Signed)
This encounter was created in error - please disregard.

## 2021-01-29 ENCOUNTER — Telehealth: Payer: Self-pay | Admitting: Adult Health

## 2021-01-29 NOTE — Telephone Encounter (Signed)
5/26 appointment cancelled due to NP out of office.

## 2021-02-05 ENCOUNTER — Ambulatory Visit: Payer: BC Managed Care – PPO | Admitting: Adult Health

## 2021-02-06 ENCOUNTER — Ambulatory Visit (INDEPENDENT_AMBULATORY_CARE_PROVIDER_SITE_OTHER): Payer: BC Managed Care – PPO

## 2021-02-06 DIAGNOSIS — I639 Cerebral infarction, unspecified: Secondary | ICD-10-CM

## 2021-02-09 LAB — CUP PACEART REMOTE DEVICE CHECK
Date Time Interrogation Session: 20220527230436
Implantable Pulse Generator Implant Date: 20200923

## 2021-02-20 NOTE — Progress Notes (Signed)
[facility] Summary Report / Loop Recorder 

## 2021-03-11 ENCOUNTER — Ambulatory Visit (INDEPENDENT_AMBULATORY_CARE_PROVIDER_SITE_OTHER): Payer: BC Managed Care – PPO

## 2021-03-11 DIAGNOSIS — I639 Cerebral infarction, unspecified: Secondary | ICD-10-CM | POA: Diagnosis not present

## 2021-03-12 LAB — CUP PACEART REMOTE DEVICE CHECK
Date Time Interrogation Session: 20220629230656
Implantable Pulse Generator Implant Date: 20200923

## 2021-03-31 NOTE — Progress Notes (Signed)
[facility] Summary Report / Loop Recorder 

## 2021-04-13 ENCOUNTER — Ambulatory Visit (INDEPENDENT_AMBULATORY_CARE_PROVIDER_SITE_OTHER): Payer: BC Managed Care – PPO

## 2021-04-13 DIAGNOSIS — I639 Cerebral infarction, unspecified: Secondary | ICD-10-CM

## 2021-04-14 LAB — CUP PACEART REMOTE DEVICE CHECK
Date Time Interrogation Session: 20220801230448
Implantable Pulse Generator Implant Date: 20200923

## 2021-05-07 NOTE — Progress Notes (Signed)
[facility] Summary Report / Loop Recorder 

## 2021-05-19 ENCOUNTER — Ambulatory Visit (INDEPENDENT_AMBULATORY_CARE_PROVIDER_SITE_OTHER): Payer: BC Managed Care – PPO

## 2021-05-19 DIAGNOSIS — R2990 Unspecified symptoms and signs involving the nervous system: Secondary | ICD-10-CM

## 2021-05-19 LAB — CUP PACEART REMOTE DEVICE CHECK
Date Time Interrogation Session: 20220903230634
Implantable Pulse Generator Implant Date: 20200923

## 2021-05-28 NOTE — Progress Notes (Signed)
[facility] Summary Report / Loop Recorder 

## 2021-06-22 ENCOUNTER — Ambulatory Visit (INDEPENDENT_AMBULATORY_CARE_PROVIDER_SITE_OTHER): Payer: BC Managed Care – PPO

## 2021-06-22 DIAGNOSIS — R2990 Unspecified symptoms and signs involving the nervous system: Secondary | ICD-10-CM

## 2021-06-25 LAB — CUP PACEART REMOTE DEVICE CHECK
Date Time Interrogation Session: 20221013081429
Implantable Pulse Generator Implant Date: 20200923

## 2021-07-01 NOTE — Progress Notes (Signed)
[facility] Summary Report / Loop Recorder 

## 2021-07-24 LAB — CUP PACEART REMOTE DEVICE CHECK
Date Time Interrogation Session: 20221108230515
Implantable Pulse Generator Implant Date: 20200923

## 2021-07-27 ENCOUNTER — Ambulatory Visit (INDEPENDENT_AMBULATORY_CARE_PROVIDER_SITE_OTHER): Payer: BC Managed Care – PPO

## 2021-07-27 DIAGNOSIS — R2990 Unspecified symptoms and signs involving the nervous system: Secondary | ICD-10-CM | POA: Diagnosis not present

## 2021-08-04 NOTE — Progress Notes (Signed)
[facility] Summary Report / Loop Recorder 

## 2021-08-05 IMAGING — DX DG CHEST 1V PORT
1 series · 1 of 1 positions shown · non-contrast
Comparison: None.

CLINICAL DATA: Shortness of breath.

EXAM:
PORTABLE CHEST 1 VIEW

[chest]
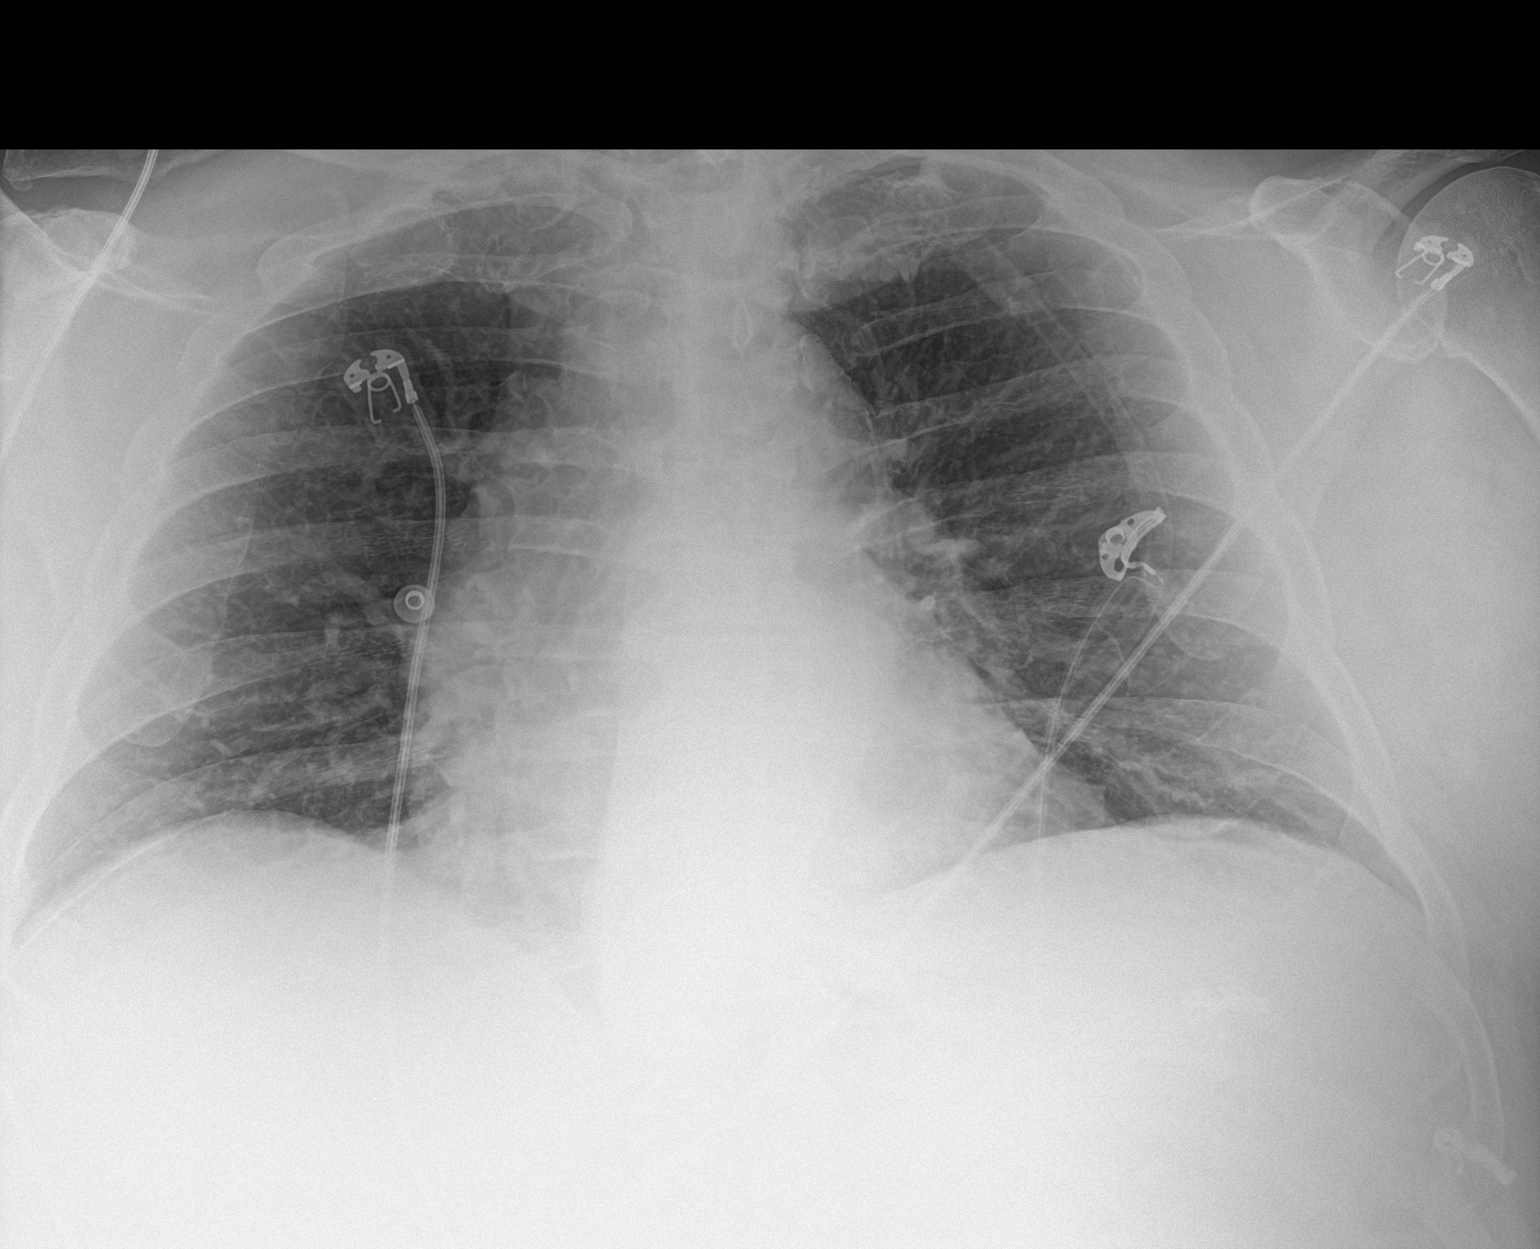

[1 of 1 positions shown; findings below may reference images not displayed]

FINDINGS: There is minimal left basilar atelectasis. Lungs otherwise clear.
Heart size is normal. Atherosclerosis noted. No pneumothorax or
pleural fluid. No acute or focal bony abnormality.
IMPRESSION: No acute disease.

Atherosclerosis.

## 2021-08-31 ENCOUNTER — Ambulatory Visit (INDEPENDENT_AMBULATORY_CARE_PROVIDER_SITE_OTHER): Payer: BC Managed Care – PPO

## 2021-08-31 DIAGNOSIS — R2990 Unspecified symptoms and signs involving the nervous system: Secondary | ICD-10-CM

## 2021-09-01 LAB — CUP PACEART REMOTE DEVICE CHECK
Date Time Interrogation Session: 20221211231017
Implantable Pulse Generator Implant Date: 20200923

## 2021-09-09 NOTE — Progress Notes (Signed)
[facility] Summary Report / Loop Recorder 

## 2021-10-05 ENCOUNTER — Ambulatory Visit (INDEPENDENT_AMBULATORY_CARE_PROVIDER_SITE_OTHER): Payer: BC Managed Care – PPO

## 2021-10-05 DIAGNOSIS — R2990 Unspecified symptoms and signs involving the nervous system: Secondary | ICD-10-CM | POA: Diagnosis not present

## 2021-10-05 LAB — CUP PACEART REMOTE DEVICE CHECK
Date Time Interrogation Session: 20230122231312
Implantable Pulse Generator Implant Date: 20200923

## 2021-10-16 NOTE — Progress Notes (Signed)
[facility] Summary Report / Loop Recorder 

## 2021-11-09 ENCOUNTER — Ambulatory Visit (INDEPENDENT_AMBULATORY_CARE_PROVIDER_SITE_OTHER): Payer: BC Managed Care – PPO

## 2021-11-09 DIAGNOSIS — R2990 Unspecified symptoms and signs involving the nervous system: Secondary | ICD-10-CM

## 2021-11-09 LAB — CUP PACEART REMOTE DEVICE CHECK
Date Time Interrogation Session: 20230227155115
Implantable Pulse Generator Implant Date: 20200923

## 2021-11-16 NOTE — Progress Notes (Signed)
[facility] Summary Report / Loop Recorder 

## 2021-12-14 ENCOUNTER — Ambulatory Visit (INDEPENDENT_AMBULATORY_CARE_PROVIDER_SITE_OTHER): Payer: BC Managed Care – PPO

## 2021-12-14 DIAGNOSIS — I639 Cerebral infarction, unspecified: Secondary | ICD-10-CM

## 2021-12-14 LAB — CUP PACEART REMOTE DEVICE CHECK
Date Time Interrogation Session: 20230331230743
Implantable Pulse Generator Implant Date: 20200923

## 2021-12-28 NOTE — Progress Notes (Signed)
[facility] Summary Report / Loop Recorder 

## 2022-01-18 ENCOUNTER — Ambulatory Visit (INDEPENDENT_AMBULATORY_CARE_PROVIDER_SITE_OTHER): Payer: BC Managed Care – PPO

## 2022-01-18 DIAGNOSIS — I639 Cerebral infarction, unspecified: Secondary | ICD-10-CM | POA: Diagnosis not present

## 2022-01-18 LAB — CUP PACEART REMOTE DEVICE CHECK
Date Time Interrogation Session: 20230507231557
Implantable Pulse Generator Implant Date: 20200923

## 2022-02-04 NOTE — Progress Notes (Signed)
[facility] Summary Report / Loop Recorder 

## 2022-02-22 ENCOUNTER — Ambulatory Visit (INDEPENDENT_AMBULATORY_CARE_PROVIDER_SITE_OTHER): Payer: BC Managed Care – PPO

## 2022-02-22 DIAGNOSIS — I639 Cerebral infarction, unspecified: Secondary | ICD-10-CM

## 2022-02-23 LAB — CUP PACEART REMOTE DEVICE CHECK
Date Time Interrogation Session: 20230609230508
Implantable Pulse Generator Implant Date: 20200923

## 2022-03-11 NOTE — Progress Notes (Signed)
[facility] Summary Report / Loop Recorder 

## 2022-03-25 LAB — CUP PACEART REMOTE DEVICE CHECK
Date Time Interrogation Session: 20230712231412
Implantable Pulse Generator Implant Date: 20200923

## 2022-03-29 ENCOUNTER — Ambulatory Visit (INDEPENDENT_AMBULATORY_CARE_PROVIDER_SITE_OTHER): Payer: BC Managed Care – PPO

## 2022-03-29 DIAGNOSIS — I639 Cerebral infarction, unspecified: Secondary | ICD-10-CM | POA: Diagnosis not present

## 2022-04-29 LAB — CUP PACEART REMOTE DEVICE CHECK
Date Time Interrogation Session: 20230814231204
Implantable Pulse Generator Implant Date: 20200923

## 2022-04-29 NOTE — Progress Notes (Signed)
[facility] Summary Report / Loop Recorder 

## 2022-05-03 ENCOUNTER — Ambulatory Visit (INDEPENDENT_AMBULATORY_CARE_PROVIDER_SITE_OTHER): Payer: BC Managed Care – PPO

## 2022-05-03 DIAGNOSIS — I639 Cerebral infarction, unspecified: Secondary | ICD-10-CM | POA: Diagnosis not present

## 2022-05-30 NOTE — Progress Notes (Signed)
[facility] Summary Report / Loop Recorder 

## 2022-05-31 LAB — CUP PACEART REMOTE DEVICE CHECK
Date Time Interrogation Session: 20230916231216
Implantable Pulse Generator Implant Date: 20200923

## 2022-06-07 ENCOUNTER — Ambulatory Visit (INDEPENDENT_AMBULATORY_CARE_PROVIDER_SITE_OTHER): Payer: BC Managed Care – PPO

## 2022-06-07 DIAGNOSIS — I639 Cerebral infarction, unspecified: Secondary | ICD-10-CM | POA: Diagnosis not present

## 2022-06-21 NOTE — Progress Notes (Signed)
[facility] Summary Report / Loop Recorder 

## 2022-07-12 ENCOUNTER — Ambulatory Visit (INDEPENDENT_AMBULATORY_CARE_PROVIDER_SITE_OTHER): Payer: BC Managed Care – PPO

## 2022-07-12 DIAGNOSIS — I639 Cerebral infarction, unspecified: Secondary | ICD-10-CM

## 2022-07-13 LAB — CUP PACEART REMOTE DEVICE CHECK
Date Time Interrogation Session: 20231029231911
Implantable Pulse Generator Implant Date: 20200923

## 2022-08-14 NOTE — Progress Notes (Signed)
[facility] Summary Report / Loop Recorder 

## 2022-08-16 ENCOUNTER — Ambulatory Visit (INDEPENDENT_AMBULATORY_CARE_PROVIDER_SITE_OTHER): Payer: BC Managed Care – PPO

## 2022-08-16 DIAGNOSIS — I639 Cerebral infarction, unspecified: Secondary | ICD-10-CM | POA: Diagnosis not present

## 2022-08-16 LAB — CUP PACEART REMOTE DEVICE CHECK
Date Time Interrogation Session: 20231203232035
Implantable Pulse Generator Implant Date: 20200923

## 2022-09-20 ENCOUNTER — Ambulatory Visit (INDEPENDENT_AMBULATORY_CARE_PROVIDER_SITE_OTHER): Payer: BC Managed Care – PPO

## 2022-09-20 DIAGNOSIS — I639 Cerebral infarction, unspecified: Secondary | ICD-10-CM | POA: Diagnosis not present

## 2022-09-21 LAB — CUP PACEART REMOTE DEVICE CHECK
Date Time Interrogation Session: 20240107231947
Implantable Pulse Generator Implant Date: 20200923

## 2022-09-23 NOTE — Progress Notes (Signed)
[facility] Summary Report / Loop Recorder 

## 2022-10-25 ENCOUNTER — Ambulatory Visit: Payer: BC Managed Care – PPO

## 2022-10-25 DIAGNOSIS — I639 Cerebral infarction, unspecified: Secondary | ICD-10-CM | POA: Diagnosis not present

## 2022-10-25 NOTE — Progress Notes (Signed)
[facility] Summary Report / Loop Recorder 

## 2022-10-26 LAB — CUP PACEART REMOTE DEVICE CHECK
Date Time Interrogation Session: 20240209231311
Implantable Pulse Generator Implant Date: 20200923

## 2022-11-29 ENCOUNTER — Ambulatory Visit (INDEPENDENT_AMBULATORY_CARE_PROVIDER_SITE_OTHER): Payer: BC Managed Care – PPO

## 2022-11-29 DIAGNOSIS — I639 Cerebral infarction, unspecified: Secondary | ICD-10-CM | POA: Diagnosis not present

## 2022-11-30 LAB — CUP PACEART REMOTE DEVICE CHECK
Date Time Interrogation Session: 20240317231818
Implantable Pulse Generator Implant Date: 20200923

## 2022-12-08 NOTE — Progress Notes (Signed)
[facility] Summary Report / Loop Recorder 

## 2023-01-03 ENCOUNTER — Ambulatory Visit (INDEPENDENT_AMBULATORY_CARE_PROVIDER_SITE_OTHER): Payer: BC Managed Care – PPO

## 2023-01-03 DIAGNOSIS — I639 Cerebral infarction, unspecified: Secondary | ICD-10-CM

## 2023-01-03 LAB — CUP PACEART REMOTE DEVICE CHECK
Date Time Interrogation Session: 20240419230921
Implantable Pulse Generator Implant Date: 20200923

## 2023-01-10 NOTE — Progress Notes (Signed)
[facility] Summary Report / Loop Recorder 

## 2023-02-03 LAB — CUP PACEART REMOTE DEVICE CHECK
Date Time Interrogation Session: 20240522230945
Implantable Pulse Generator Implant Date: 20200923

## 2023-02-08 ENCOUNTER — Ambulatory Visit (INDEPENDENT_AMBULATORY_CARE_PROVIDER_SITE_OTHER): Payer: BC Managed Care – PPO

## 2023-02-08 DIAGNOSIS — I639 Cerebral infarction, unspecified: Secondary | ICD-10-CM

## 2023-02-08 NOTE — Progress Notes (Signed)
[facility] Summary Report / Loop Recorder 

## 2023-03-04 NOTE — Progress Notes (Signed)
[facility] Summary Report / Loop Recorder 

## 2023-03-10 LAB — CUP PACEART REMOTE DEVICE CHECK
Date Time Interrogation Session: 20240624231218
Implantable Pulse Generator Implant Date: 20200923

## 2023-03-14 ENCOUNTER — Ambulatory Visit (INDEPENDENT_AMBULATORY_CARE_PROVIDER_SITE_OTHER): Payer: BC Managed Care – PPO

## 2023-03-14 DIAGNOSIS — I639 Cerebral infarction, unspecified: Secondary | ICD-10-CM | POA: Diagnosis not present

## 2023-03-31 NOTE — Progress Notes (Signed)
[facility] Summary Report / Loop Recorder 

## 2023-04-11 ENCOUNTER — Ambulatory Visit: Payer: BC Managed Care – PPO

## 2023-04-11 DIAGNOSIS — I639 Cerebral infarction, unspecified: Secondary | ICD-10-CM

## 2023-04-11 LAB — CUP PACEART REMOTE DEVICE CHECK
Date Time Interrogation Session: 20240727230653
Implantable Pulse Generator Implant Date: 20200923

## 2023-04-13 DIAGNOSIS — I639 Cerebral infarction, unspecified: Secondary | ICD-10-CM | POA: Diagnosis not present

## 2023-04-18 ENCOUNTER — Ambulatory Visit: Payer: BC Managed Care – PPO

## 2023-04-27 NOTE — Progress Notes (Signed)
[facility] Summary Report / Loop Recorder 

## 2023-05-15 LAB — CUP PACEART REMOTE DEVICE CHECK
Date Time Interrogation Session: 20240829231121
Implantable Pulse Generator Implant Date: 20200923

## 2023-05-23 ENCOUNTER — Ambulatory Visit (INDEPENDENT_AMBULATORY_CARE_PROVIDER_SITE_OTHER): Payer: BC Managed Care – PPO

## 2023-05-23 DIAGNOSIS — I639 Cerebral infarction, unspecified: Secondary | ICD-10-CM

## 2023-06-08 NOTE — Progress Notes (Signed)
[facility] Summary Report / Loop Recorder 

## 2023-06-15 LAB — CUP PACEART REMOTE DEVICE CHECK
Date Time Interrogation Session: 20241001231207
Implantable Pulse Generator Implant Date: 20200923

## 2023-06-27 ENCOUNTER — Ambulatory Visit (INDEPENDENT_AMBULATORY_CARE_PROVIDER_SITE_OTHER): Payer: BC Managed Care – PPO

## 2023-06-27 DIAGNOSIS — I639 Cerebral infarction, unspecified: Secondary | ICD-10-CM

## 2023-07-12 NOTE — Progress Notes (Signed)
[facility] Summary Report / Loop Recorder 

## 2023-08-01 ENCOUNTER — Ambulatory Visit (INDEPENDENT_AMBULATORY_CARE_PROVIDER_SITE_OTHER): Payer: BC Managed Care – PPO

## 2023-08-01 DIAGNOSIS — I639 Cerebral infarction, unspecified: Secondary | ICD-10-CM | POA: Diagnosis not present

## 2023-08-01 LAB — CUP PACEART REMOTE DEVICE CHECK
Date Time Interrogation Session: 20241117230241
Implantable Pulse Generator Implant Date: 20200923

## 2023-08-25 NOTE — Progress Notes (Signed)
[facility] Summary Report / Loop Recorder 

## 2023-09-05 ENCOUNTER — Ambulatory Visit: Payer: BC Managed Care – PPO

## 2023-10-10 ENCOUNTER — Ambulatory Visit: Payer: BC Managed Care – PPO

## 2023-10-10 DIAGNOSIS — I639 Cerebral infarction, unspecified: Secondary | ICD-10-CM | POA: Diagnosis not present

## 2023-10-11 LAB — CUP PACEART REMOTE DEVICE CHECK: Date Time Interrogation Session: 20250126230327

## 2023-11-14 ENCOUNTER — Ambulatory Visit (INDEPENDENT_AMBULATORY_CARE_PROVIDER_SITE_OTHER): Payer: BC Managed Care – PPO

## 2023-11-14 DIAGNOSIS — I639 Cerebral infarction, unspecified: Secondary | ICD-10-CM | POA: Diagnosis not present

## 2023-11-15 LAB — CUP PACEART REMOTE DEVICE CHECK
Date Time Interrogation Session: 20250302230109
Implantable Pulse Generator Implant Date: 20200923

## 2023-11-17 NOTE — Progress Notes (Signed)
 [facility] Summary Report / Loop Recorder

## 2023-11-17 NOTE — Addendum Note (Signed)
 Addended by: Geralyn Flash D on: 11/17/2023 02:34 PM   Modules accepted: Orders

## 2023-12-15 NOTE — Addendum Note (Signed)
 Addended by: Geralyn Flash D on: 12/15/2023 02:29 PM   Modules accepted: Orders

## 2023-12-15 NOTE — Progress Notes (Signed)
 [facility] Summary Report / Loop Recorder

## 2023-12-19 ENCOUNTER — Ambulatory Visit (INDEPENDENT_AMBULATORY_CARE_PROVIDER_SITE_OTHER): Payer: BC Managed Care – PPO

## 2023-12-19 DIAGNOSIS — I639 Cerebral infarction, unspecified: Secondary | ICD-10-CM | POA: Diagnosis not present

## 2023-12-20 ENCOUNTER — Encounter: Payer: Self-pay | Admitting: Internal Medicine

## 2023-12-20 LAB — CUP PACEART REMOTE DEVICE CHECK
Date Time Interrogation Session: 20250406230053
Implantable Pulse Generator Implant Date: 20200923

## 2024-01-23 ENCOUNTER — Ambulatory Visit (INDEPENDENT_AMBULATORY_CARE_PROVIDER_SITE_OTHER): Payer: BC Managed Care – PPO

## 2024-01-23 DIAGNOSIS — I639 Cerebral infarction, unspecified: Secondary | ICD-10-CM | POA: Diagnosis not present

## 2024-01-23 LAB — CUP PACEART REMOTE DEVICE CHECK
Date Time Interrogation Session: 20250511233314
Implantable Pulse Generator Implant Date: 20200923

## 2024-01-24 ENCOUNTER — Ambulatory Visit: Payer: Self-pay | Admitting: Internal Medicine

## 2024-01-31 NOTE — Addendum Note (Signed)
 Addended by: Edra Govern D on: 01/31/2024 05:32 PM   Modules accepted: Orders

## 2024-01-31 NOTE — Progress Notes (Signed)
 [facility] Summary Report / Loop Recorder

## 2024-02-23 ENCOUNTER — Ambulatory Visit

## 2024-02-27 ENCOUNTER — Ambulatory Visit: Payer: BC Managed Care – PPO

## 2024-03-12 NOTE — Progress Notes (Signed)
 [facility] Summary Report / Loop Recorder

## 2024-03-26 ENCOUNTER — Ambulatory Visit

## 2024-04-02 ENCOUNTER — Ambulatory Visit: Payer: BC Managed Care – PPO

## 2024-04-26 ENCOUNTER — Ambulatory Visit

## 2024-05-07 ENCOUNTER — Ambulatory Visit: Payer: BC Managed Care – PPO

## 2024-05-28 ENCOUNTER — Ambulatory Visit

## 2024-06-07 ENCOUNTER — Ambulatory Visit

## 2024-06-28 ENCOUNTER — Ambulatory Visit

## 2024-07-09 ENCOUNTER — Ambulatory Visit

## 2024-07-29 ENCOUNTER — Ambulatory Visit

## 2024-07-30 ENCOUNTER — Ambulatory Visit

## 2024-07-31 ENCOUNTER — Ambulatory Visit

## 2024-08-09 ENCOUNTER — Ambulatory Visit

## 2024-08-29 ENCOUNTER — Ambulatory Visit

## 2024-08-31 ENCOUNTER — Ambulatory Visit

## 2024-09-10 ENCOUNTER — Ambulatory Visit

## 2024-09-29 ENCOUNTER — Ambulatory Visit

## 2024-10-01 ENCOUNTER — Ambulatory Visit

## 2024-10-11 ENCOUNTER — Ambulatory Visit

## 2024-10-30 ENCOUNTER — Ambulatory Visit

## 2024-11-01 ENCOUNTER — Ambulatory Visit

## 2024-12-02 ENCOUNTER — Ambulatory Visit

## 2025-01-02 ENCOUNTER — Ambulatory Visit

## 2025-02-02 ENCOUNTER — Ambulatory Visit

## 2025-03-05 ENCOUNTER — Ambulatory Visit

## 2025-04-05 ENCOUNTER — Ambulatory Visit

## 2025-05-06 ENCOUNTER — Ambulatory Visit

## 2025-06-06 ENCOUNTER — Ambulatory Visit

## 2025-07-07 ENCOUNTER — Ambulatory Visit

## 2025-08-07 ENCOUNTER — Ambulatory Visit

## 2025-09-07 ENCOUNTER — Ambulatory Visit

## 2025-10-08 ENCOUNTER — Ambulatory Visit
